# Patient Record
Sex: Female | Born: 1950 | Race: Black or African American | Hispanic: No | Marital: Married | Smoking: Former smoker
Health system: Southern US, Community
[De-identification: ages and names within clinical notes are randomized; demographics above are authoritative.]

## PROBLEM LIST (undated history)

## (undated) DIAGNOSIS — T7840XA Allergy, unspecified, initial encounter: Secondary | ICD-10-CM

## (undated) DIAGNOSIS — E785 Hyperlipidemia, unspecified: Secondary | ICD-10-CM

## (undated) DIAGNOSIS — I10 Essential (primary) hypertension: Secondary | ICD-10-CM

## (undated) HISTORY — PX: ABDOMINAL HYSTERECTOMY: SHX81

## (undated) HISTORY — DX: Essential (primary) hypertension: I10

## (undated) HISTORY — DX: Hyperlipidemia, unspecified: E78.5

## (undated) HISTORY — DX: Allergy, unspecified, initial encounter: T78.40XA

## (undated) HISTORY — PX: BREAST LUMPECTOMY: SHX2

## (undated) HISTORY — PX: APPENDECTOMY: SHX54

## (undated) HISTORY — PX: CHOLECYSTECTOMY: SHX55

---

## 2010-11-23 DIAGNOSIS — F430 Acute stress reaction: Secondary | ICD-10-CM | POA: Insufficient documentation

## 2010-11-23 DIAGNOSIS — N951 Menopausal and female climacteric states: Secondary | ICD-10-CM | POA: Insufficient documentation

## 2011-12-27 DIAGNOSIS — K5790 Diverticulosis of intestine, part unspecified, without perforation or abscess without bleeding: Secondary | ICD-10-CM | POA: Insufficient documentation

## 2012-12-31 LAB — HM MAMMOGRAPHY

## 2013-01-05 ENCOUNTER — Ambulatory Visit: Payer: BC Managed Care – PPO | Admitting: Emergency Medicine

## 2013-01-05 ENCOUNTER — Ambulatory Visit: Payer: BC Managed Care – PPO

## 2013-01-05 VITALS — BP 138/88 | HR 74 | Temp 97.8°F | Resp 18 | Ht 64.0 in | Wt 179.0 lb

## 2013-01-05 DIAGNOSIS — M25562 Pain in left knee: Secondary | ICD-10-CM

## 2013-01-05 DIAGNOSIS — M25569 Pain in unspecified knee: Secondary | ICD-10-CM

## 2013-01-05 DIAGNOSIS — M2390 Unspecified internal derangement of unspecified knee: Secondary | ICD-10-CM

## 2013-01-05 LAB — POCT CBC
Granulocyte percent: 38.6 %G (ref 37–80)
HCT, POC: 42.4 % (ref 37.7–47.9)
Hemoglobin: 13.8 g/dL (ref 12.2–16.2)
Lymph, poc: 3.8 — AB (ref 0.6–3.4)
MPV: 11.3 fL (ref 0–99.8)
POC Granulocyte: 2.7 (ref 2–6.9)
POC MID %: 7 %M (ref 0–12)

## 2013-01-05 LAB — POCT SEDIMENTATION RATE: POCT SED RATE: 55 mm/hr — AB (ref 0–22)

## 2013-01-05 MED ORDER — NAPROXEN SODIUM 550 MG PO TABS: 550.0000 mg | ORAL_TABLET | Freq: Two times a day (BID) | ORAL | Status: AC

## 2013-01-05 NOTE — Progress Notes (Signed)
[facility] [address], [place] [address] [phone]  Date:  01/05/2013   Name:  Cynthia Stewart   DOB:  1950/06/17   MRN:  [id number]  PCP:  Primus Bravo, NP    Chief Complaint: Knee Pain   History of Present Illness:  Cynthia Stewart is a 62 y.o. very pleasant female patient who presents with the following:  No history of injury.  Awoke Thursday morning with pain in her left knee.  No antecedent infection, fever or chills.  History of prior left knee pain seen by ortho and no diagnosis made per patient.  Says pain is worse with ambulation and the swelling interferes with flexion and extension.  No improvement with over the counter medications or other home remedies. Denies other complaint or health concern today.   There are no active problems to display for this patient.   Past Medical History  Diagnosis Date  . Allergy   . Hypertension     Past Surgical History  Procedure Laterality Date  . Abdominal hysterectomy    . Appendectomy    . Cholecystectomy      History  Substance Use Topics  . Smoking status: Never Smoker   . Smokeless tobacco: Not on file  . Alcohol Use: Not on file    Family History  Problem Relation Age of Onset  . Hypertension Mother   . Diabetes Mother     Allergies  Allergen Reactions  . Codeine   . Percocet [Oxycodone-Acetaminophen]     Medication list has been reviewed and updated.  No current outpatient prescriptions on file prior to visit.   No current facility-administered medications on file prior to visit.    Review of Systems:  As per HPI, otherwise negative.    Physical Examination: Filed Vitals:   01/05/13 1534  BP: 138/88  Pulse: 74  Temp: 97.8 F (36.6 C)  Resp: 18   Filed Vitals:   01/05/13 1534  Height: 5\' 4"  (1.626 m)  Weight: 179 lb (81.194 kg)   Body mass index is 30.71 kg/(m^2). Ideal Body Weight: Weight in (lb) to have BMI = 25: 145.3   GEN: WDWN, NAD, Non-toxic,  Alert & Oriented x 3 HEENT: Atraumatic, Normocephalic.  Ears and Nose: No external deformity. EXTR: No clubbing/cyanosis/edema NEURO: Normal gait.  PSYCH: Normally interactive. Conversant. Not depressed or anxious appearing.  Calm demeanor.  LEFT knee:  Moderate effusion and tenderness. Warm.  Unable to extend past 160 degrees or flex past 90 degrees.  Joint stable.    Assessment and Plan: Internal derangement knee Anaprox Crutches  Signed,  Phillips Odor, MD  [facility] reading (PRIMARY) by  Dr. Dareen Piano.  Negative knee.    Results for orders placed in visit on 01/05/13  POCT CBC      Result Value Range   WBC 7.0  4.6 - 10.2 K/uL   Lymph, poc 3.8 (*) 0.6 - 3.4   POC LYMPH PERCENT 54.4 (*) 10 - 50 %L   MID (cbc) 0.5  0 - 0.9   POC MID % 7.0  0 - 12 %M   POC Granulocyte 2.7  2 - 6.9   Granulocyte percent 38.6  37 - 80 %G   RBC 4.67  4.04 - 5.48 M/uL   Hemoglobin 13.8  12.2 - 16.2 g/dL   HCT, POC 08.6  57.8 - 47.9 %   MCV 90.8  80 - 97 fL   MCH, POC 29.6  27 - 31.2 pg  MCHC 32.5  31.8 - 35.4 g/dL   RDW, POC 16.1     Platelet Count, POC 200  142 - 424 K/uL   MPV 11.3  0 - 99.8 fL

## 2013-01-05 NOTE — Patient Instructions (Signed)
Wear and Tear Disorders of the Knee (Arthritis, Osteoarthritis)  Everyone will experience wear and tear injuries (arthritis, osteoarthritis) of the knee. These are the changes we all get as we age. They come from the joint stress of daily living. The amount of cartilage damage in your knee and your symptoms determine if you need surgery. Mild problems require approximately two months recovery time. More severe problems take several months to recover. With mild problems, your surgeon may find worn and rough cartilage surfaces. With severe changes, your surgeon may find cartilage that has completely worn away and exposed the bone. Loose bodies of bone and cartilage, bone spurs (excess bone growth), and injuries to the menisci (cushions between the large bones of your leg) are also common. All of these problems can cause pain.  For a mild wear and tear problem, rough cartilage may simply need to be shaved and smoothed. For more severe problems with areas of exposed bone, your surgeon may use an instrument for roughing up the bone surfaces to stimulate new cartilage growth. Loose bodies are usually removed. Torn menisci may be trimmed or repaired.  ABOUT THE ARTHROSCOPIC PROCEDURE  Arthroscopy is a surgical technique. It allows your orthopedic surgeon to diagnose and treat your knee injury with accuracy. The surgeon looks into your knee through a small scope. The scope is like a small (pencil-sized) telescope. Arthroscopy is less invasive than open knee surgery. You can expect a more rapid recovery. After the procedure, you will be moved to a recovery area until most of the effects of the medication have worn off. Your caregiver will discuss the test results with you.  RECOVERY  The severity of the arthritis and the type of procedure performed will determine recovery time. Other important factors include age, physical condition, medical conditions, and the type of rehabilitation program. Strengthening your muscles after  arthroscopy helps guarantee a better recovery. Follow your caregiver's instructions. Use crutches, rest, elevate, ice, and do knee exercises as instructed. Your caregivers will help you and instruct you with exercises and other physical therapy required to regain your mobility, muscle strength, and functioning following surgery. Only take over-the-counter or prescription medicines for pain, discomfort, or fever as directed by your caregiver.   SEEK MEDICAL CARE IF:   · There is increased bleeding (more than a small spot) from the wound.  · You notice redness, swelling, or increasing pain in the wound.  · Pus is coming from wound.  · You develop an unexplained oral temperature above 102° F (38.9° C) , or as your caregiver suggests.  · You notice a foul smell coming from the wound or dressing.  · You have severe pain with motion of the knee.  SEEK IMMEDIATE MEDICAL CARE IF:   · You develop a rash.  · You have difficulty breathing.  · You have any allergic problems.  MAKE SURE YOU:   · Understand these instructions.  · Will watch your condition.  · Will get help right away if you are not doing well or get worse.  Document Released: 03/04/2000 Document Revised: 05/30/2011 Document Reviewed: 08/01/2007  Exit[employer]® Patient Information ©2014 [employer].

## 2013-01-07 ENCOUNTER — Telehealth: Payer: Self-pay

## 2013-01-07 NOTE — Telephone Encounter (Signed)
Pt request a copy of xrays of left knee for an ortho eval

## 2013-09-03 ENCOUNTER — Ambulatory Visit (INDEPENDENT_AMBULATORY_CARE_PROVIDER_SITE_OTHER): Payer: BC Managed Care – PPO | Admitting: Physician Assistant

## 2013-09-03 VITALS — BP 126/72 | HR 59 | Temp 97.8°F | Ht 65.5 in | Wt 179.4 lb

## 2013-09-03 DIAGNOSIS — H9209 Otalgia, unspecified ear: Secondary | ICD-10-CM

## 2013-09-03 DIAGNOSIS — J329 Chronic sinusitis, unspecified: Secondary | ICD-10-CM

## 2013-09-03 DIAGNOSIS — R42 Dizziness and giddiness: Secondary | ICD-10-CM

## 2013-09-03 MED ORDER — FLUCONAZOLE 150 MG PO TABS: 150.0000 mg | ORAL_TABLET | Freq: Once | ORAL | Status: DC

## 2013-09-03 MED ORDER — AMOXICILLIN-POT CLAVULANATE 875-125 MG PO TABS: 1.0000 | ORAL_TABLET | Freq: Two times a day (BID) | ORAL | Status: DC

## 2013-09-03 NOTE — Progress Notes (Signed)
   Subjective:    Patient ID: Cynthia Stewart, female    DOB: [birth year], 63 y.o.   MRN: [id number]  HPI 63 year old female presents for evaluation of possible sinus infection.  States she has had a total of 3 weeks of right sided facial pain, nasal congestion, and rhinorrhea.  She has had dizziness and slight nausea but no vomiting.  Denies cough, tinnitus, fever, chills, SOB, wheezing, sore throat, or otalgia.  Hx of sinus infections that present similarly to this.   Has been taken Allegra OTC and tylenol which does "take the edge off" of her headache.      Review of Systems  Constitutional: Negative for fever and chills.  HENT: Positive for congestion, rhinorrhea and sinus pressure. Negative for ear pain and sore throat.   Respiratory: Negative for cough, shortness of breath and wheezing.   Gastrointestinal: Positive for nausea. Negative for vomiting and abdominal pain.  Neurological: Positive for dizziness. Negative for headaches.       Objective:   Physical Exam  Constitutional: She is oriented to person, place, and time. She appears well-developed and well-nourished.  HENT:  Head: Normocephalic and atraumatic.  Right Ear: Hearing, tympanic membrane, external ear and ear canal normal.  Left Ear: Hearing, tympanic membrane, external ear and ear canal normal.  Nose: Right sinus exhibits maxillary sinus tenderness. Right sinus exhibits no frontal sinus tenderness. Left sinus exhibits no maxillary sinus tenderness and no frontal sinus tenderness.  Mouth/Throat: Uvula is midline, oropharynx is clear and moist and mucous membranes are normal.  Eyes: Conjunctivae and EOM are normal. Pupils are equal, round, and reactive to light.  Cardiovascular: Normal rate, regular rhythm and normal heart sounds.   Pulmonary/Chest: Effort normal and breath sounds normal.  Neurological: She is alert and oriented to person, place, and time.  Psychiatric: She has a normal mood and affect. Her behavior is  normal. Judgment and thought content normal.          Assessment & Plan:  Sinus infection - Plan: amoxicillin-clavulanate (AUGMENTIN) 875-125 MG per tablet  Dizziness and giddiness  Otalgia  Will treat for sinus infection with Augmentin 875 mg bid x 10 days Recommend Mucinex twice daily as directed May continue tylenol prn pain RTC precautions discussed. Follow up if symptoms worsen or fail to improve.

## 2013-09-11 NOTE — Progress Notes (Signed)
Appointment scheduled on 10/11/2013  @ 830. With Dr. Conley RollsLe to establish care.

## 2013-10-11 ENCOUNTER — Encounter: Payer: Self-pay | Admitting: Family Medicine

## 2013-10-11 ENCOUNTER — Ambulatory Visit (INDEPENDENT_AMBULATORY_CARE_PROVIDER_SITE_OTHER): Payer: BC Managed Care – PPO | Admitting: Family Medicine

## 2013-10-11 VITALS — BP 118/70 | HR 63 | Temp 97.7°F | Resp 16 | Ht 63.75 in | Wt 177.0 lb

## 2013-10-11 DIAGNOSIS — R7309 Other abnormal glucose: Secondary | ICD-10-CM

## 2013-10-11 DIAGNOSIS — R232 Flushing: Secondary | ICD-10-CM

## 2013-10-11 DIAGNOSIS — E785 Hyperlipidemia, unspecified: Secondary | ICD-10-CM

## 2013-10-11 DIAGNOSIS — I10 Essential (primary) hypertension: Secondary | ICD-10-CM

## 2013-10-11 DIAGNOSIS — N951 Menopausal and female climacteric states: Secondary | ICD-10-CM

## 2013-10-11 DIAGNOSIS — G4700 Insomnia, unspecified: Secondary | ICD-10-CM

## 2013-10-11 DIAGNOSIS — R739 Hyperglycemia, unspecified: Secondary | ICD-10-CM

## 2013-10-11 LAB — LIPID PANEL
Cholesterol: 253 mg/dL — ABNORMAL HIGH (ref 0–200)
HDL: 72 mg/dL (ref >39)
LDL Cholesterol: 167 mg/dL — ABNORMAL HIGH (ref 0–99)
Total CHOL/HDL Ratio: 3.5 ratio
Triglycerides: 71 mg/dL (ref <150)
VLDL: 14 mg/dL (ref 0–40)

## 2013-10-11 LAB — COMPLETE METABOLIC PANEL WITHOUT GFR
AST: 18 U/L (ref 0–37)
BUN: 20 mg/dL (ref 6–23)
GFR, Est Non African American: 59 mL/min — ABNORMAL LOW
Glucose, Bld: 105 mg/dL — ABNORMAL HIGH (ref 70–99)

## 2013-10-11 LAB — COMPLETE METABOLIC PANEL WITH GFR
ALT: 15 U/L (ref 0–35)
Albumin: 4.4 g/dL (ref 3.5–5.2)
Alkaline Phosphatase: 54 U/L (ref 39–117)
CO2: 28 mEq/L (ref 19–32)
Calcium: 10.2 mg/dL (ref 8.4–10.5)
Chloride: 100 mEq/L (ref 96–112)
Creat: 1.03 mg/dL (ref 0.50–1.10)
GFR, Est African American: 68 mL/min
Potassium: 4.5 mEq/L (ref 3.5–5.3)
Sodium: 137 mEq/L (ref 135–145)
Total Bilirubin: 0.7 mg/dL (ref 0.2–1.2)
Total Protein: 7.8 g/dL (ref 6.0–8.3)

## 2013-10-11 LAB — TSH: TSH: 0.994 u[IU]/mL (ref 0.350–4.500)

## 2013-10-11 LAB — POCT GLYCOSYLATED HEMOGLOBIN (HGB A1C): Hemoglobin A1C: 6.4

## 2013-10-11 MED ORDER — ROSUVASTATIN CALCIUM 5 MG PO TABS: 5.0000 mg | ORAL_TABLET | Freq: Every day | ORAL | Status: DC

## 2013-10-11 MED ORDER — ESTRADIOL 0.05 MG/24HR TD PTTW: 1.0000 | MEDICATED_PATCH | TRANSDERMAL | Status: DC

## 2013-10-11 MED ORDER — VALSARTAN-HYDROCHLOROTHIAZIDE 320-12.5 MG PO TABS: 1.0000 | ORAL_TABLET | Freq: Every day | ORAL | Status: DC

## 2013-10-11 MED ORDER — TRAZODONE HCL 50 MG PO TABS: 25.0000 mg | ORAL_TABLET | Freq: Every evening | ORAL | Status: DC | PRN

## 2013-10-11 NOTE — Progress Notes (Signed)
Chief Complaint:  Chief Complaint  Patient presents with  . Establish Care    and medication refill - CRESTOR AND DIOVAN-HCT    HPI: Cynthia Stewart is a 63 y.o. female who is here for refills on her cholesterol medicine and also her blood pressure medicine She has been taking her meds without SEs SHe was seeing Dr Hollice [place]Gibson in [place] but would like to have someone closer She was putl on generic vivelle dot and that was not working for her, the [place].  She states that the brand vivelle dot actually improed her sxs, she would liek to be on it again if her nsurance approves, it was changed because of the insurance formulary She was previously borderline DM and would like to get her A1c checked   Past Medical History  Diagnosis Date  . Allergy   . Hypertension   . Hyperlipidemia    Past Surgical History  Procedure Laterality Date  . Abdominal hysterectomy    . Appendectomy    . Cholecystectomy     History   Social History  . Marital Status: Married    Spouse Name: N/A    Number of Children: N/A  . Years of Education: N/A   Social History Main Topics  . Smoking status: Never Smoker   . Smokeless tobacco: None  . Alcohol Use: None  . Drug Use: None  . Sexual Activity: None   Other Topics Concern  . None   Social History Narrative  . None   Family History  Problem Relation Age of Onset  . Hypertension Mother   . Diabetes Mother    Allergies  Allergen Reactions  . Codeine Other (See Comments)    HALLUCINATION  . Percocet [Oxycodone-Acetaminophen] Itching and Other (See Comments)    HALLUCINATION   Prior to Admission medications   Medication Sig Start Date End Date Taking? Authorizing Provider  Ergocalciferol (VITAMIN D2 PO) Take by mouth 2 (two) times a week.   Yes Historical Provider, MD  estradiol (CLIMARA - DOSED IN MG/24 HR) 0.05 mg/24hr patch Place 0.05 mg onto the skin once a week.   Yes Historical Provider, MD  rosuvastatin (CRESTOR) 5 MG  tablet Take 5 mg by mouth at bedtime.   Yes Historical Provider, MD  valsartan-hydrochlorothiazide (DIOVAN-HCT) 320-12.5 MG per tablet Take 1 tablet by mouth daily.   Yes Historical Provider, MD  amoxicillin-clavulanate (AUGMENTIN) 875-125 MG per tablet Take 1 tablet by mouth 2 (two) times daily. 09/03/13   Heather Jaquita RectorM Marte, PA-C  fluconazole (DIFLUCAN) 150 MG tablet Take 1 tablet (150 mg total) by mouth once. Repeat if needed 09/03/13   Nelva NayHeather M Marte, PA-C  naproxen sodium (ANAPROX DS) 550 MG tablet Take 1 tablet (550 mg total) by mouth 2 (two) times daily with a meal. 01/05/13 01/05/14  Phillips OdorJeffery Anderson, MD     ROS: The patient denies fevers, chills, night sweats, unintentional weight loss, chest pain, palpitations, wheezing, dyspnea on exertion, nausea, vomiting, abdominal pain, dysuria, hematuria, melena, numbness, weakness, or tingling.   All other systems have been reviewed and were otherwise negative with the exception of those mentioned in the HPI and as above.    PHYSICAL EXAM: Filed Vitals:   10/11/13 0829  BP: 118/70  Pulse: 63  Temp: 97.7 F (36.5 C)  Resp: 16   Filed Vitals:   10/11/13 0829  Height: 5' 3.75" (1.619 m)  Weight: 177 lb (80.287 kg)   Body mass index is 30.63 kg/(m^2).  General: Alert, no acute distress HEENT:  Normocephalic, atraumatic, oropharynx patent. EOMI, PERRLA Cardiovascular:  Regular rate and rhythm, no rubs murmurs or gallops.  No Carotid bruits, radial pulse intact. No pedal edema.  Respiratory: Clear to auscultation bilaterally.  No wheezes, rales, or rhonchi.  No cyanosis, no use of accessory musculature GI: No organomegaly, abdomen is soft and non-tender, positive bowel sounds.  No masses. Skin: No rashes. Neurologic: Facial musculature symmetric. Psychiatric: Patient is appropriate throughout our interaction. Lymphatic: No cervical lymphadenopathy Musculoskeletal: Gait intact.   LABS: Results for orders placed in visit on 10/11/13    COMPLETE METABOLIC PANEL WITH GFR      Result Value Ref Range   Sodium 137  135 - 145 mEq/L   Potassium 4.5  3.5 - 5.3 mEq/L   Chloride 100  96 - 112 mEq/L   CO2 28  19 - 32 mEq/L   Glucose, Bld 105 (*) 70 - 99 mg/dL   BUN 20  6 - 23 mg/dL   Creat 1.61  0.96 - 0.45 mg/dL   Total Bilirubin 0.7  0.2 - 1.2 mg/dL   Alkaline Phosphatase 54  39 - 117 U/L   AST 18  0 - 37 U/L   ALT 15  0 - 35 U/L   Total Protein 7.8  6.0 - 8.3 g/dL   Albumin 4.4  3.5 - 5.2 g/dL   Calcium 40.9  8.4 - 81.1 mg/dL   GFR, Est African American 68     GFR, Est Non African American 59 (*)   LIPID PANEL      Result Value Ref Range   Cholesterol 253 (*) 0 - 200 mg/dL   Triglycerides 71  <914 mg/dL   HDL 72  >78 mg/dL   Total CHOL/HDL Ratio 3.5     VLDL 14  0 - 40 mg/dL   LDL Cholesterol 295 (*) 0 - 99 mg/dL  TSH      Result Value Ref Range   TSH 0.994  0.350 - 4.500 uIU/mL  POCT GLYCOSYLATED HEMOGLOBIN (HGB A1C)      Result Value Ref Range   Hemoglobin A1C 6.4       EKG/XRAY:   Primary read interpreted by Dr. Conley Rolls at [facility].   ASSESSMENT/PLAN: Encounter Diagnoses  Name Primary?  . Essential hypertension Yes  . Other and unspecified hyperlipidemia   . Hyperglycemia   . Hot flashes   . Insomnia    Refilled meds Labs Vivelle generic does not work for her. Brand name works for her .  She was on Vivelle DOT brand name prior. WOuld liek that again F/u in 6 months   Gross sideeffects, risk and benefits, and alternatives of medications d/w patient. Patient is aware that all medications have potential sideeffects and we are unable to predict every sideeffect or drug-drug interaction that may occur.  Hamilton Capri PHUONG, DO 10/13/2013 3:53 PM

## 2013-10-13 ENCOUNTER — Encounter: Payer: Self-pay | Admitting: Family Medicine

## 2013-10-15 ENCOUNTER — Encounter: Payer: Self-pay | Admitting: Family Medicine

## 2013-10-15 ENCOUNTER — Telehealth: Payer: Self-pay | Admitting: Family Medicine

## 2013-10-15 NOTE — Telephone Encounter (Signed)
Unable to leave a message, VM full, , will send a letter

## 2013-10-22 ENCOUNTER — Telehealth: Payer: Self-pay

## 2013-10-22 NOTE — Telephone Encounter (Signed)
Pt of Dr. Conley RollsLe says her heat was out of rhythm for about 10 minutes, but now it is back to normal, and she feels ok. She would like to know if Dr.Le would like her to have an EKG done. Please advise pt

## 2013-10-22 NOTE — Telephone Encounter (Signed)
Spoke to pt, she stated she wasn't doing any type of activity Lasted 10 min, breathing was a bit faster.  Denies headache, nausea, dizziness, not light headed, no blurry vision,  No SOB, No chest pain, no arm pain no shoulder pain, denies muscle weakness, no tingling, denies jaw pain, no speech changes.  i advised her this can be normal, but just be aware if any of these symptoms occur please call back, come in or go to the ED.  Patient expressed an understanding and felt much better after our conversation.

## 2014-01-15 ENCOUNTER — Ambulatory Visit (INDEPENDENT_AMBULATORY_CARE_PROVIDER_SITE_OTHER): Payer: BC Managed Care – PPO | Admitting: Physician Assistant

## 2014-01-15 VITALS — BP 134/82 | HR 78 | Temp 98.0°F | Resp 20 | Ht 63.5 in | Wt 179.4 lb

## 2014-01-15 DIAGNOSIS — I10 Essential (primary) hypertension: Secondary | ICD-10-CM | POA: Insufficient documentation

## 2014-01-15 DIAGNOSIS — J018 Other acute sinusitis: Secondary | ICD-10-CM

## 2014-01-15 MED ORDER — AMOXICILLIN 875 MG PO TABS: 1750.0000 mg | ORAL_TABLET | Freq: Two times a day (BID) | ORAL | Status: DC

## 2014-01-15 NOTE — Patient Instructions (Signed)

## 2014-01-15 NOTE — Progress Notes (Signed)
   Subjective:    Patient ID: Cynthia Stewart, female    DOB: 03-18-1951, 63 y.o.   MRN: [id number]  HPI Patient presents with 4 weeks of sinus pressure/pain, ear pressure, rhinorrhea, and sneezing. Sx had been the same and maintained with tylenol, allegra and nettie pot up until Thursday. Starting Thursday began to also feel dizzy and off balance, have intermittent chills, nausea, and maxillary pain. Denies cough or fever. Has had sinus infections in the past and has h/o seasonal allergies. Denies sick contacts.  Review of Systems  Constitutional: Positive for chills, diaphoresis and fatigue. Negative for fever.  HENT: Positive for congestion, dental problem (upper), ear pain (pressure), postnasal drip, rhinorrhea, sinus pressure, sneezing and sore throat (in am). Negative for ear discharge, facial swelling, hearing loss and tinnitus.   Eyes: Positive for pain, discharge (clear) and itching. Negative for visual disturbance.  Respiratory: Negative for cough, chest tightness, shortness of breath and wheezing.   Cardiovascular: Negative for chest pain, palpitations and leg swelling.  Gastrointestinal: Positive for nausea. Negative for vomiting, abdominal pain, diarrhea and constipation.  Musculoskeletal: Negative for neck pain and neck stiffness.  Skin: Negative for rash.  Allergic/Immunologic: Positive for environmental allergies (seasonal). Negative for food allergies.  Neurological: Positive for dizziness (off balance). Negative for syncope, light-headedness and headaches.  Hematological: Negative for adenopathy.       Objective:   Physical Exam  Constitutional: She is oriented to person, place, and time. She appears well-developed and well-nourished. No distress.  Blood pressure 134/82, pulse 78, temperature 98 F (36.7 C), temperature source Oral, resp. rate 20, height 5' 3.5" (1.613 m), weight 179 lb 6.4 oz (81.375 kg), SpO2 96.00%.'  HENT:  Head: Normocephalic and atraumatic.  Right  Ear: Tympanic membrane, external ear and ear canal normal. No drainage, swelling or tenderness. No middle ear effusion.  Left Ear: Tympanic membrane, external ear and ear canal normal. No drainage, swelling or tenderness. Left ear middle ear effusion: serous.  Nose: Mucosal edema, rhinorrhea and sinus tenderness present. Right sinus exhibits maxillary sinus tenderness and frontal sinus tenderness. Left sinus exhibits maxillary sinus tenderness and frontal sinus tenderness.  Mouth/Throat: Uvula is midline. Posterior oropharyngeal erythema (mild) present. No oropharyngeal exudate or posterior oropharyngeal edema.  Eyes: Pupils are equal, round, and reactive to light. Right eye exhibits no discharge. Left eye exhibits no discharge. No scleral icterus.  Neck: Normal range of motion. Neck supple.  Cardiovascular: Regular rhythm and normal heart sounds.  Exam reveals no gallop and no friction rub.   No murmur heard. Pulmonary/Chest: Effort normal and breath sounds normal. No respiratory distress. She has no wheezes. She has no rales.  Abdominal: Soft. Bowel sounds are normal. She exhibits no mass. There is no tenderness.  Lymphadenopathy:    She has cervical adenopathy.  Neurological: She is alert and oriented to person, place, and time.  Skin: Skin is warm and dry. She is not diaphoretic. No erythema. No pallor.        Assessment & Plan:  1. Acute sinusitis - amoxicillin (AMOXIL) 875 MG tablet; Take 2 tablets (1,750 mg total) by mouth 2 (two) times daily.  Dispense: 20 tablet; Refill: 0 - Encouraged to get plenty of fluid and water.  - Can continue tylenol for pain and start mucinex.  Janan Ridgeishira Veneda Kirksey PA-C  [facility] 01/15/2014 7:41 PM

## 2014-01-16 NOTE — Progress Notes (Signed)
I have discussed this case with Ms. Brewington, PA-C and agree.  

## 2014-02-07 ENCOUNTER — Encounter: Payer: Self-pay | Admitting: Family Medicine

## 2014-02-07 ENCOUNTER — Ambulatory Visit (INDEPENDENT_AMBULATORY_CARE_PROVIDER_SITE_OTHER): Payer: BC Managed Care – PPO | Admitting: Family Medicine

## 2014-02-07 VITALS — BP 128/76 | HR 72 | Temp 97.7°F | Resp 16 | Ht 64.0 in | Wt 178.6 lb

## 2014-02-07 DIAGNOSIS — I10 Essential (primary) hypertension: Secondary | ICD-10-CM

## 2014-02-07 DIAGNOSIS — E7800 Pure hypercholesterolemia, unspecified: Secondary | ICD-10-CM | POA: Insufficient documentation

## 2014-02-07 DIAGNOSIS — Z1159 Encounter for screening for other viral diseases: Secondary | ICD-10-CM

## 2014-02-07 DIAGNOSIS — J3089 Other allergic rhinitis: Secondary | ICD-10-CM

## 2014-02-07 DIAGNOSIS — Z1239 Encounter for other screening for malignant neoplasm of breast: Secondary | ICD-10-CM

## 2014-02-07 DIAGNOSIS — E119 Type 2 diabetes mellitus without complications: Secondary | ICD-10-CM | POA: Insufficient documentation

## 2014-02-07 DIAGNOSIS — Z23 Encounter for immunization: Secondary | ICD-10-CM

## 2014-02-07 DIAGNOSIS — Z0000 Encounter for general adult medical examination without abnormal findings: Secondary | ICD-10-CM

## 2014-02-07 DIAGNOSIS — R7309 Other abnormal glucose: Secondary | ICD-10-CM

## 2014-02-07 LAB — COMPREHENSIVE METABOLIC PANEL
ALBUMIN: 3.9 g/dL (ref 3.5–5.2)
ALT: 12 U/L (ref 0–35)
AST: 18 U/L (ref 0–37)
Alkaline Phosphatase: 47 U/L (ref 39–117)
BUN: 14 mg/dL (ref 6–23)
CALCIUM: 9.5 mg/dL (ref 8.4–10.5)
CHLORIDE: 101 meq/L (ref 96–112)
CO2: 25 meq/L (ref 19–32)
Creat: 0.96 mg/dL (ref 0.50–1.10)
Glucose, Bld: 113 mg/dL — ABNORMAL HIGH (ref 70–99)
Potassium: 4.3 mEq/L (ref 3.5–5.3)
SODIUM: 138 meq/L (ref 135–145)
TOTAL PROTEIN: 6.9 g/dL (ref 6.0–8.3)
Total Bilirubin: 0.6 mg/dL (ref 0.2–1.2)

## 2014-02-07 LAB — CBC WITH DIFFERENTIAL/PLATELET
Basophils Absolute: 0 10*3/uL (ref 0.0–0.1)
Basophils Relative: 1 % (ref 0–1)
EOS ABS: 0.1 10*3/uL (ref 0.0–0.7)
EOS PCT: 3 % (ref 0–5)
HEMATOCRIT: 41.3 % (ref 36.0–46.0)
HEMOGLOBIN: 14.5 g/dL (ref 12.0–15.0)
Lymphocytes Relative: 54 % — ABNORMAL HIGH (ref 12–46)
Lymphs Abs: 2.3 10*3/uL (ref 0.7–4.0)
MCH: 29.4 pg (ref 26.0–34.0)
MCHC: 35.1 g/dL (ref 30.0–36.0)
MCV: 83.8 fL (ref 78.0–100.0)
MONOS PCT: 7 % (ref 3–12)
MPV: 12.7 fL — ABNORMAL HIGH (ref 9.4–12.4)
Monocytes Absolute: 0.3 10*3/uL (ref 0.1–1.0)
Neutro Abs: 1.5 10*3/uL — ABNORMAL LOW (ref 1.7–7.7)
Neutrophils Relative %: 35 % — ABNORMAL LOW (ref 43–77)
Platelets: 209 10*3/uL (ref 150–400)
RBC: 4.93 MIL/uL (ref 3.87–5.11)
RDW: 14.2 % (ref 11.5–15.5)
WBC: 4.3 10*3/uL (ref 4.0–10.5)

## 2014-02-07 LAB — POCT UA - MICROSCOPIC ONLY
CASTS, UR, LPF, POC: NEGATIVE
CRYSTALS, UR, HPF, POC: NEGATIVE
Mucus, UA: POSITIVE
Yeast, UA: NEGATIVE

## 2014-02-07 LAB — POCT URINALYSIS DIPSTICK
Bilirubin, UA: NEGATIVE
Glucose, UA: NEGATIVE
Ketones, UA: NEGATIVE
LEUKOCYTES UA: NEGATIVE
Nitrite, UA: NEGATIVE
PROTEIN UA: NEGATIVE
SPEC GRAV UA: 1.015
UROBILINOGEN UA: 0.2
pH, UA: 5

## 2014-02-07 LAB — HEPATITIS C ANTIBODY: HCV Ab: NEGATIVE

## 2014-02-07 LAB — HEMOGLOBIN A1C
Hgb A1c MFr Bld: 7 % — ABNORMAL HIGH (ref <5.7)
Mean Plasma Glucose: 154 mg/dL — ABNORMAL HIGH (ref <117)

## 2014-02-07 MED ORDER — AZELASTINE HCL 0.1 % NA SOLN: 2.0000 | Freq: Two times a day (BID) | NASAL | Status: DC

## 2014-02-07 MED ORDER — ZOSTER VACCINE LIVE 19400 UNT/0.65ML ~~LOC~~ SOLR: 0.6500 mL | Freq: Once | SUBCUTANEOUS | Status: DC

## 2014-02-07 NOTE — Progress Notes (Signed)
IDENTIFYING INFORMATION  Cynthia Stewart / DOB: 1950-04-05 / MRN: [id number]  The patient has Essential hypertension; Elevated hemoglobin A1c measurement; Elevated LDL cholesterol level; and Environmental and seasonal allergies on her problem list.  SUBJECTIVE  Chief Complaint: Annual Exam   History of present illness: Cynthia Stewart is a 63 y.o. year old female who presents for an annual screening.  She reports that she is trying to start a diet and exercise program at this time, and thinks that the lack of these things is causing her blood sugar to be abnormally high.  She denies a family history of heart disease, and reports that her mother had a history of diabetes controlled by diet.    She reports that she does not use her trazodone for sleep, and uses melatonin 10 mg po qhs, as this works better for her.    She would like an annual mammography screening ordered today.  She has had benign bilateral breast masses removed in the past.    She denies dysthymic mood and anhedonia.  She denies a history of depression. She reports having work stress and financial stress.     She had a benign subcutaneous sternal mass that was removed in 2011. She reports having shingles in 2014 around the left waist.  She received treatment for this and has no difficulty with this now.    Last physical: About this time one year ago (2014) Pap smear: 2013, negative for HPV and cervical changes Mammogram: October 2014 Colonoscopy: 2013, negative for polyps, not documented in [facility]: Unknown, will receive this today.  Pneumovax: N/A  Zostavax: Never received  Influenza: Negative Eye exam: June of 15 Dental exam: June of 15   She  has a past medical history of Allergy; Hypertension; and Hyperlipidemia.    She has a current medication list which includes the following prescription(s): ergocalciferol, estradiol, rosuvastatin, trazodone, and  valsartan-hydrochlorothiazide.  Cynthia Stewart is allergic to codeine and percocet. She  reports that she has never smoked. She has never used smokeless tobacco. She reports that she does not drink alcohol. She  reports that she currently engages in sexual activity.  The patient  has past surgical history that includes Abdominal hysterectomy; Appendectomy; and Cholecystectomy.  Her family history includes Diabetes in her mother; Hyperlipidemia in her mother and sister; Hypertension in her brother, mother, and sister.  Review of Systems  Constitutional: Negative for fever, chills, weight loss, malaise/fatigue and diaphoresis.  HENT: Positive for congestion.   Eyes: Negative.   Respiratory: Negative.   Cardiovascular: Negative.   Gastrointestinal: Negative.   Genitourinary: Negative.   Musculoskeletal: Negative.   Skin: Negative.   Neurological: Positive for headaches. Negative for weakness.  Endo/Heme/Allergies: Negative.   Psychiatric/Behavioral: Negative.     OBJECTIVE  There were no vitals taken for this visit. The patient's body mass index is 30.64 kg/(m^2).  Physical Exam  Constitutional: She is oriented to person, place, and time. She appears well-developed and well-nourished. No distress.  HENT:  Head: Normocephalic.  Right Ear: External ear normal.  Left Ear: External ear normal.  Nose: Nose normal.  Mouth/Throat: No oropharyngeal exudate.  Eyes: Conjunctivae and EOM are normal. Pupils are equal, round, and reactive to light.  Neck: Normal range of motion. No thyromegaly present.  Cardiovascular: Normal rate, regular rhythm, normal heart sounds and intact distal pulses.  Exam reveals no gallop and no friction rub.   No murmur heard. Respiratory: Effort normal and  breath sounds normal. She has no wheezes.  GI: Soft. Bowel sounds are normal. She exhibits no distension.  Musculoskeletal: Normal range of motion. She exhibits no edema or tenderness.  Lymphadenopathy:    She has  no cervical adenopathy.  Neurological: She is alert and oriented to person, place, and time. She has normal reflexes. No cranial nerve deficit. She exhibits normal muscle tone. Coordination normal.  Skin: Skin is warm and dry. She is not diaphoretic.  Psychiatric: She has a normal mood and affect. Her behavior is normal. Judgment and thought content normal.    No results found for this or any previous visit (from the past 24 hour(s)).  ASSESSMENT & PLAN  Cynthia Stewart was seen today for annual exam.  Diagnoses and associated orders for this visit:  Encounter for annual physical exam - POCT UA - Microscopic Only - POCT urinalysis dipstic  Elevated hemoglobin A1c measurement - POCT urinalysis dipstick  Elevated LDL cholesterol level - Hemoglobin A1c  Essential hypertension - CBC with Differential - Comprehensive metabolic panel - POCT UA - Microscopic Only  Need for hepatitis C screening test -     Hep C antibody  Influenza vaccine needed - Flu Vaccine QUAD 36+ mos IM  Need for prophylactic vaccination with combined diphtheria-tetanus-pertussis (DTP) vaccine - Tdap vaccine greater than or equal to 7yo IM  Environmental and seasonal allergies - azelastine (ASTELIN) 0.1 % nasal spray; Place 2 sprays into both nostrils 2 (two) times daily. Use in each nostril as directed -     Continue using Allegra qhs -     Continue Flonase qd  Screening for Breast Cancer -      Amb referral for mommogram     The patient was instructed to to call or comeback to clinic as needed, or should symptoms warrant.  Deliah BostonMichael Clark, MHS, PA-C [facility] 02/07/2014 9:57 AM

## 2014-02-07 NOTE — Progress Notes (Signed)
Agree with A/P. Dr Geovany Trudo 

## 2014-04-18 ENCOUNTER — Encounter: Payer: Self-pay | Admitting: Family Medicine

## 2014-04-18 ENCOUNTER — Ambulatory Visit (INDEPENDENT_AMBULATORY_CARE_PROVIDER_SITE_OTHER): Payer: BLUE CROSS/BLUE SHIELD | Admitting: Family Medicine

## 2014-04-18 VITALS — BP 130/90 | HR 72 | Temp 97.9°F | Resp 16 | Ht 63.75 in | Wt 182.2 lb

## 2014-04-18 DIAGNOSIS — G4700 Insomnia, unspecified: Secondary | ICD-10-CM

## 2014-04-18 DIAGNOSIS — I10 Essential (primary) hypertension: Secondary | ICD-10-CM

## 2014-04-18 DIAGNOSIS — E119 Type 2 diabetes mellitus without complications: Secondary | ICD-10-CM

## 2014-04-18 DIAGNOSIS — R5383 Other fatigue: Secondary | ICD-10-CM

## 2014-04-18 DIAGNOSIS — E559 Vitamin D deficiency, unspecified: Secondary | ICD-10-CM

## 2014-04-18 DIAGNOSIS — E785 Hyperlipidemia, unspecified: Secondary | ICD-10-CM

## 2014-04-18 LAB — LIPID PANEL
Cholesterol: 222 mg/dL — ABNORMAL HIGH (ref 0–200)
HDL: 71 mg/dL (ref >39)
LDL Cholesterol: 139 mg/dL — ABNORMAL HIGH (ref 0–99)
Total CHOL/HDL Ratio: 3.1 ratio
Triglycerides: 58 mg/dL (ref <150)
VLDL: 12 mg/dL (ref 0–40)

## 2014-04-18 LAB — COMPLETE METABOLIC PANEL WITH GFR
AST: 23 U/L (ref 0–37)
Albumin: 3.9 g/dL (ref 3.5–5.2)
Alkaline Phosphatase: 55 U/L (ref 39–117)
BUN: 20 mg/dL (ref 6–23)
CO2: 28 mEq/L (ref 19–32)
Chloride: 101 mEq/L (ref 96–112)
GFR, Est African American: 81 mL/min
Potassium: 4.5 mEq/L (ref 3.5–5.3)
Total Bilirubin: 0.7 mg/dL (ref 0.2–1.2)
Total Protein: 6.9 g/dL (ref 6.0–8.3)

## 2014-04-18 LAB — COMPLETE METABOLIC PANEL WITHOUT GFR
ALT: 19 U/L (ref 0–35)
Calcium: 9.6 mg/dL (ref 8.4–10.5)
Creat: 0.89 mg/dL (ref 0.50–1.10)
GFR, Est Non African American: 70 mL/min
Glucose, Bld: 119 mg/dL — ABNORMAL HIGH (ref 70–99)
Sodium: 137 meq/L (ref 135–145)

## 2014-04-18 LAB — TSH: TSH: 1.673 u[IU]/mL (ref 0.350–4.500)

## 2014-04-18 LAB — POCT GLYCOSYLATED HEMOGLOBIN (HGB A1C): Hemoglobin A1C: 6.5

## 2014-04-18 MED ORDER — LORAZEPAM 0.5 MG PO TABS: ORAL_TABLET | ORAL | Status: DC

## 2014-04-18 MED ORDER — VITAMIN D (ERGOCALCIFEROL) 1.25 MG (50000 UNIT) PO CAPS: 50000.0000 [IU] | ORAL_CAPSULE | ORAL | Status: DC

## 2014-04-18 MED ORDER — LORAZEPAM 0.5 MG PO TABS
ORAL_TABLET | ORAL | Status: DC
Start: 2014-04-18 — End: 2014-04-18

## 2014-04-18 NOTE — Progress Notes (Signed)
 Chief Complaint:  Chief Complaint  Patient presents with  . Follow-up    6 mos  . Hypertension  . Hyperlipidemia  . Medication Refill    Vitamin D    HPI: Cynthia Stewart is a 64 y.o. female who is here for fatigue and a recheck of her cholesterol , DM and HTN She has other issues that she would like to talk about as well such as her insomnia Has not had a lot of sleep , she is very busy at work and the work keeps piling on since she is doing 3 jobs in 1, she does auto claims She is so tired she does not want to exercise, but she is stressed and can;t sleep, she is hoping things will change in April when she gets a Writer, the current one does not want to do anything. To make matters worse She has hot flashes and also husband is having his own foot problems and is getting up and down for his work  She does not take trazadone anymore because it did not work She usually takes melatonin, it does not carry her through the night,  She has DM and HTN, denies SEs, is compliant, deneis hypoglycemia. Denies Chest pain, dizziness, SOB Her BP at home 130/70 s   BP Readings from Last 3 Encounters:  04/18/14 130/90  02/07/14 128/76  01/15/14 134/82   Wt Readings from Last 3 Encounters:  04/18/14 182 lb 3.2 oz (82.645 kg)  02/07/14 178 lb 9.6 oz (81.012 kg)  01/15/14 179 lb 6.4 oz (81.375 kg)      Past Medical History  Diagnosis Date  . Allergy   . Hypertension   . Hyperlipidemia    Past Surgical History  Procedure Laterality Date  . Abdominal hysterectomy    . Appendectomy    . Cholecystectomy     History   Social History  . Marital Status: Married    Spouse Name: N/A    Number of Children: N/A  . 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 Smoking status: Never Smoker   . Smokeless tobacco: Never Used  . Alcohol Use: No  . Drug Use: None  . Sexual Activity: Yes   Other Topics Concern  . None    Social History Narrative   Married. Education: [employer]. Exercise: No.   Family History  Problem Relation Age of Onset  . Hypertension Mother   . Diabetes Mother   . Hyperlipidemia Mother   . Hyperlipidemia Sister   . Hypertension Sister   . Hypertension Brother    Allergies  Allergen Reactions  . Codeine Other (See Comments)    HALLUCINATION  . Percocet [Oxycodone-Acetaminophen] Itching and Other (See Comments)    HALLUCINATION   Prior to Admission medications   Medication Sig Start Date End Date Taking? Authorizing Provider  azelastine (ASTELIN) 0.1 % nasal spray Place 2 sprays into both nostrils 2 (two) times daily. Use in each nostril as directed 02/07/14  Yes Dolores Lory, PA-C  Ergocalciferol (VITAMIN D2 PO) Take by mouth 2 (two) times a week.   Yes Historical Provider, MD  estradiol (VIVELLE-DOT) 0.05 MG/24HR patch Place 1 patch (0.05 mg total) onto the skin 2 (two) times a week. 10/11/13  Yes  P , DO  rosuvastatin (CRESTOR) 5 MG tablet Take 1 tablet (5 mg total) by mouth at bedtime. 10/11/13  Yes   P , DO  valsartan-hydrochlorothiazide (DIOVAN-HCT) 320-12.5 MG per tablet Take 1 tablet by mouth daily. 10/11/13  Yes  P , DO  traZODone (DESYREL) 50 MG tablet Take 0.5-1 tablets (25-50 mg total) by mouth at bedtime as needed for sleep. Patient not taking: Reported on 04/18/2014 10/11/13    P , DO  zoster vaccine live, PF, (ZOSTAVAX) 40981 UNT/0.65ML injection Inject 19,400 Units into the skin once. Patient not taking: Reported on 04/18/2014 02/07/14    P , DO  zoster vaccine live, PF, (ZOSTAVAX) 19147 UNT/0.65ML injection Inject 19,400 Units into the skin once. Patient not taking: Reported on 04/18/2014 02/07/14   Dolores Lory, PA-C     ROS: The patient denies fevers, chills, night sweats, unintentional weight loss, chest pain, palpitations, wheezing, dyspnea on exertion, nausea, vomiting, abdominal pain, dysuria, hematuria, melena,  numbness, weakness, or tingling.  All other systems have been reviewed and were otherwise negative with the exception of those mentioned in the HPI and as above.    PHYSICAL EXAM: Filed Vitals:   04/18/14 0818  BP: 130/90  Pulse: 72  Temp: 97.9 F (36.6 C)  Resp: 16   Filed Vitals:   04/18/14 0818  Height: 5' 3.75" (1.619 m)  Weight: 182 lb 3.2 oz (82.645 kg)   Body mass index is 31.53 kg/(m^2).  General: Alert, no acute distress HEENT:  Normocephalic, atraumatic, oropharynx patent. EOMI, PERRLA, no thyroidmegaly, fundo exam nl Cardiovascular:  Regular rate and rhythm, no rubs murmurs or gallops.  No Carotid bruits, radial pulse intact. No pedal edema.  Respiratory: Clear to auscultation bilaterally.  No wheezes, rales, or rhonchi.  No cyanosis, no use of accessory musculature GI: No organomegaly, abdomen is soft and non-tender, positive bowel sounds.  No masses. Skin: No rashes. Neurologic: Facial musculature symmetric. Normal microfilament exam bilaterally Psychiatric: Patient is appropriate throughout our interaction. Lymphatic: No cervical lymphadenopathy Musculoskeletal: Gait intact. 5/5 strength   LABS: Results for orders placed or performed in visit on 04/18/14  COMPLETE METABOLIC PANEL WITH GFR  Result Value Ref Range   Sodium 137 135 - 145 mEq/L   Potassium 4.5 3.5 - 5.3 mEq/L   Chloride 101 96 - 112 mEq/L   CO2 28 19 - 32 mEq/L   Glucose, Bld 119 (H) 70 - 99 mg/dL   BUN 20 6 - 23 mg/dL   Creat 8.29 5.62 - 1.30 mg/dL   Total Bilirubin 0.7 0.2 - 1.2 mg/dL   Alkaline Phosphatase 55 39 - 117 U/L   AST 23 0 - 37 U/L   ALT 19 0 - 35 U/L   Total Protein 6.9 6.0 - 8.3 g/dL   Albumin 3.9 3.5 - 5.2 g/dL   Calcium 9.6 8.4 - 86.5 mg/dL   GFR, Est African American 81 mL/min   GFR, Est Non African American 70 mL/min  TSH  Result Value Ref Range   TSH 1.673 0.350 - 4.500 uIU/mL  Vitamin D, 25-hydroxy  Result Value Ref Range   Vit D, 25-Hydroxy 17 (L) 30 - 100 ng/mL   Lipid panel  Result Value Ref Range   Cholesterol 222 (H) 0 - 200 mg/dL   Triglycerides 58 <784 mg/dL   HDL 71 >69 mg/dL   Total CHOL/HDL Ratio 3.1 Ratio   VLDL 12 0 - 40 mg/dL   LDL Cholesterol 629 (H) 0 - 99 mg/dL  POCT glycosylated hemoglobin (Hb A1C)  Result Value Ref Range   Hemoglobin A1C 6.5      EKG/XRAY:  [facility]   ASSESSMENT/PLAN: Encounter Diagnoses  Name Primary?  . Essential hypertension Yes  . Type 2 diabetes mellitus without complication   . Hyperlipidemia   . Insomnia   . Vitamin D deficiency   . Other fatigue    Labs pending She wants to try diet and exercise for her DM She will try increasing her soy intake ie protein, shakes etc to see if it helps with her hotflashes, already on vivelle Cont with current meds for cholesterol and HTN I have given her ativan to see if it helps with stress level and sleep. F/u in 3 months  Recommend : ADA diet, BP goal <140/90, daily foot exams, tobacco cessation if smoking, annual eye exam, annual flu vaccine, PNA vaccine if age and time appropriate.   Gross sideeffects, risk and benefits, and alternatives of medications d/w patient. Patient is aware that all medications have potential sideeffects and we are unable to predict every sideeffect or drug-drug interaction that may occur.  ,  PHUONG, DO 04/22/2014 11:39 AM

## 2014-04-19 LAB — VITAMIN D 25 HYDROXY (VIT D DEFICIENCY, FRACTURES): Vit D, 25-Hydroxy: 17 ng/mL — ABNORMAL LOW (ref 30–100)

## 2014-05-21 ENCOUNTER — Ambulatory Visit (INDEPENDENT_AMBULATORY_CARE_PROVIDER_SITE_OTHER): Payer: BLUE CROSS/BLUE SHIELD | Admitting: Family Medicine

## 2014-05-21 VITALS — BP 138/80 | HR 88 | Temp 98.0°F | Resp 16 | Ht 64.0 in | Wt 175.0 lb

## 2014-05-21 DIAGNOSIS — B349 Viral infection, unspecified: Secondary | ICD-10-CM

## 2014-05-21 DIAGNOSIS — R51 Headache: Secondary | ICD-10-CM | POA: Diagnosis not present

## 2014-05-21 DIAGNOSIS — E860 Dehydration: Secondary | ICD-10-CM | POA: Diagnosis not present

## 2014-05-21 DIAGNOSIS — R197 Diarrhea, unspecified: Secondary | ICD-10-CM

## 2014-05-21 DIAGNOSIS — Z8639 Personal history of other endocrine, nutritional and metabolic disease: Secondary | ICD-10-CM | POA: Diagnosis not present

## 2014-05-21 DIAGNOSIS — R519 Headache, unspecified: Secondary | ICD-10-CM

## 2014-05-21 DIAGNOSIS — R112 Nausea with vomiting, unspecified: Secondary | ICD-10-CM | POA: Diagnosis not present

## 2014-05-21 LAB — POCT URINALYSIS DIPSTICK
Glucose, UA: NEGATIVE
KETONES UA: NEGATIVE
LEUKOCYTES UA: NEGATIVE
NITRITE UA: NEGATIVE
PH UA: 5.5
PROTEIN UA: 30
Spec Grav, UA: >=1.03
Urobilinogen, UA: 0.2

## 2014-05-21 LAB — POCT CBC
GRANULOCYTE PERCENT: 73.4 % (ref 37–80)
HCT, POC: 47.7 % (ref 37.7–47.9)
Hemoglobin: 15.7 g/dL (ref 12.2–16.2)
LYMPH, POC: 1.7 (ref 0.6–3.4)
MCH, POC: 28.8 pg (ref 27–31.2)
MCHC: 32.9 g/dL (ref 31.8–35.4)
MCV: 87.7 fL (ref 80–97)
MID (cbc): 0.1 (ref 0–0.9)
MPV: 9.4 fL (ref 0–99.8)
PLATELET COUNT, POC: 201 10*3/uL (ref 142–424)
POC Granulocyte: 5 (ref 2–6.9)
POC LYMPH PERCENT: 24.7 %L (ref 10–50)
POC MID %: 1.9 %M (ref 0–12)
RBC: 5.44 M/uL (ref 4.04–5.48)
RDW, POC: 13.9 %
WBC: 6.8 10*3/uL (ref 4.6–10.2)

## 2014-05-21 LAB — POCT UA - MICROSCOPIC ONLY
Bacteria, U Microscopic: NEGATIVE
Casts, Ur, LPF, POC: NEGATIVE
Crystals, Ur, HPF, POC: NEGATIVE
Mucus, UA: NEGATIVE
YEAST UA: NEGATIVE

## 2014-05-21 LAB — GLUCOSE, POCT (MANUAL RESULT ENTRY): POC Glucose: 125 mg/dl — AB (ref 70–99)

## 2014-05-21 MED ORDER — ONDANSETRON 4 MG PO TBDP: ORAL_TABLET | ORAL | Status: DC

## 2014-05-21 MED ORDER — ONDANSETRON 4 MG PO TBDP
4.0000 mg | ORAL_TABLET | Freq: Once | ORAL | Status: AC
  Administered 2014-05-21: 4 mg via ORAL

## 2014-05-21 NOTE — Patient Instructions (Signed)
Drink plenty of fluids. Gradually advance diet as discussed.  Take ondansetron one every 4-6 hours if needed for nausea or vomiting  Return at anytime if worse or go to the emergency room if severe abdominal pain or severely worsening symptoms

## 2014-05-21 NOTE — Progress Notes (Signed)
Subjective: 64 year old lady who started getting ill on Sunday. Monday she felt queasy all day. Yesterday she started vomiting and has persisted in doing that. She last vomited this morning. She's not been able to take anything by mouth because she just feels so bad. She has a headache and feels achy. She said she had a temperature of which the last 101 last night. She had her first diarrheal L movement this morning. She is not constipated much. She does not usually have gastrointestinal problems. She did not go to work Monday because she is feeling bad. Yesterday she attempted going for a little while, but left early because she was feeling too bad. She does not have much abdominal pain, primarily just the nausea. No dysuria.  Objective: She appears ill. She is laying on the exam table preferred to keep her eyes closed. Her TMs are normal. Throat clear. Neck supple without nodes. Chest is clear to auscultation. Heart regular without murmurs. Has bowel sounds. Soft masses or tenderness. Does just generally feel bad though. She last urinated when she came to the office this morning.  Assessment: Nausea and vomiting Headache History of fever Probable viral illness, but need to check a couple things line history of hyperglycemia  Plan: Check labs  Results for orders placed or performed in visit on 05/21/14  POCT CBC  Result Value Ref Range   WBC 6.8 4.6 - 10.2 K/uL   Lymph, poc 1.7 0.6 - 3.4   POC LYMPH PERCENT 24.7 10 - 50 %L   MID (cbc) 0.1 0 - 0.9   POC MID % 1.9 0 - 12 %M   POC Granulocyte 5.0 2 - 6.9   Granulocyte percent 73.4 37 - 80 %G   RBC 5.44 4.04 - 5.48 M/uL   Hemoglobin 15.7 12.2 - 16.2 g/dL   HCT, POC 81.147.7 91.437.7 - 47.9 %   MCV 87.7 80 - 97 fL   MCH, POC 28.8 27 - 31.2 pg   MCHC 32.9 31.8 - 35.4 g/dL   RDW, POC 78.213.9 %   Platelet Count, POC 201 142 - 424 K/uL   MPV 9.4 0 - 99.8 fL  POCT glucose (manual entry)  Result Value Ref Range   POC Glucose 125 (A) 70 - 99 mg/dl   POCT UA - Microscopic Only  Result Value Ref Range   WBC, Ur, HPF, POC 0-1    RBC, urine, microscopic 2-3    Bacteria, U Microscopic neg    Mucus, UA neg    Epithelial cells, urine per micros 0-1    Crystals, Ur, HPF, POC neg    Casts, Ur, LPF, POC neg    Yeast, UA neg   POCT urinalysis dipstick  Result Value Ref Range   Color, UA yellow    Clarity, UA clear    Glucose, UA neg    Bilirubin, UA smal    Ketones, UA neg    Spec Grav, UA >=1.030    Blood, UA moderate    pH, UA 5.5    Protein, UA 30    Urobilinogen, UA 0.2    Nitrite, UA neg    Leukocytes, UA Negative

## 2014-06-23 ENCOUNTER — Other Ambulatory Visit: Payer: Self-pay | Admitting: Family Medicine

## 2014-06-25 ENCOUNTER — Other Ambulatory Visit: Payer: Self-pay | Admitting: Family Medicine

## 2014-06-30 NOTE — Telephone Encounter (Signed)
Called in.

## 2014-07-18 ENCOUNTER — Ambulatory Visit (INDEPENDENT_AMBULATORY_CARE_PROVIDER_SITE_OTHER): Payer: BLUE CROSS/BLUE SHIELD | Admitting: Family Medicine

## 2014-07-18 ENCOUNTER — Encounter: Payer: Self-pay | Admitting: Family Medicine

## 2014-07-18 VITALS — BP 112/78 | HR 72 | Temp 97.7°F | Resp 18 | Ht 64.0 in | Wt 180.0 lb

## 2014-07-18 DIAGNOSIS — I10 Essential (primary) hypertension: Secondary | ICD-10-CM | POA: Diagnosis not present

## 2014-07-18 DIAGNOSIS — E785 Hyperlipidemia, unspecified: Secondary | ICD-10-CM | POA: Diagnosis not present

## 2014-07-18 DIAGNOSIS — E559 Vitamin D deficiency, unspecified: Secondary | ICD-10-CM

## 2014-07-18 DIAGNOSIS — E119 Type 2 diabetes mellitus without complications: Secondary | ICD-10-CM | POA: Diagnosis not present

## 2014-07-18 DIAGNOSIS — J0100 Acute maxillary sinusitis, unspecified: Secondary | ICD-10-CM

## 2014-07-18 LAB — COMPLETE METABOLIC PANEL WITH GFR
ALT: 19 U/L (ref 0–35)
Albumin: 4.2 g/dL (ref 3.5–5.2)
BUN: 15 mg/dL (ref 6–23)
CO2: 27 mEq/L (ref 19–32)
Chloride: 99 mEq/L (ref 96–112)
Creat: 0.86 mg/dL (ref 0.50–1.10)
GFR, Est African American: 84 mL/min
GFR, Est Non African American: 73 mL/min
Glucose, Bld: 101 mg/dL — ABNORMAL HIGH (ref 70–99)
Potassium: 3.9 mEq/L (ref 3.5–5.3)
Sodium: 136 mEq/L (ref 135–145)
Total Protein: 7.6 g/dL (ref 6.0–8.3)

## 2014-07-18 LAB — COMPLETE METABOLIC PANEL WITHOUT GFR
AST: 20 U/L (ref 0–37)
Alkaline Phosphatase: 58 U/L (ref 39–117)
Calcium: 9.8 mg/dL (ref 8.4–10.5)
Total Bilirubin: 0.7 mg/dL (ref 0.2–1.2)

## 2014-07-18 LAB — LIPID PANEL
Cholesterol: 257 mg/dL — ABNORMAL HIGH (ref 0–200)
HDL: 79 mg/dL (ref >=46)
LDL Cholesterol: 161 mg/dL — ABNORMAL HIGH (ref 0–99)
Total CHOL/HDL Ratio: 3.3 Ratio
Triglycerides: 85 mg/dL (ref <150)
VLDL: 17 mg/dL (ref 0–40)

## 2014-07-18 LAB — POCT GLYCOSYLATED HEMOGLOBIN (HGB A1C): Hemoglobin A1C: 6.6

## 2014-07-18 LAB — TSH: TSH: 0.82 u[IU]/mL (ref 0.350–4.500)

## 2014-07-18 MED ORDER — VITAMIN D (ERGOCALCIFEROL) 1.25 MG (50000 UNIT) PO CAPS: 50000.0000 [IU] | ORAL_CAPSULE | ORAL | Status: DC

## 2014-07-18 MED ORDER — AMOXICILLIN-POT CLAVULANATE 875-125 MG PO TABS: 1.0000 | ORAL_TABLET | Freq: Two times a day (BID) | ORAL | Status: DC

## 2014-07-18 MED ORDER — DIOVAN HCT 320-12.5 MG PO TABS: 1.0000 | ORAL_TABLET | Freq: Every day | ORAL | Status: DC

## 2014-07-18 NOTE — Progress Notes (Signed)
Chief Complaint:  Chief Complaint  Patient presents with  . Follow-up    htn  . Headache    x3 weeks   . Dental Pain  . Facial Pain  . Cough    yellow mucous     HPI: Cynthia Stewart is a 64 y.o. female who is here for 3 days of acute sinusitis symptoms, cough , mucus, yellow. She was slightly better but it has gotten worse. She has facial pain, dental pain, intermittent headaches. She is still at her current job, however she is hoping that they'll be new changes.Her manager is leaving and not sure, he is worthless, she is doing more than 2 peoples job. 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 She feels that a lot of her stress and elevated blood pressure and anxiousness and difficulty sleeping is related to her job. She has HTN and is Compliant, no dizziness, no chest pin She was dizzy x1 but that was possibly related to her sinus symptoms. She did not have these before.  BP Readings from Last 3 Encounters:  07/18/14 112/78  05/21/14 138/80  04/18/14 130/90      Past Medical History  Diagnosis Date  . Allergy   . Hypertension   . Hyperlipidemia    Past Surgical History  Procedure Laterality Date  . Abdominal hysterectomy    . Appendectomy    . Cholecystectomy     History   Social History  . Marital Status: Married    Spouse Name: N/A  . Number of Children: N/A  . 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 Smoking status: Never Smoker   . Smokeless tobacco: Never Used  . Alcohol Use: No  . Drug Use: Not on file  . Sexual Activity: Yes   Other Topics Concern  . None   Social History Narrative   Married. Education: [employer]. Exercise: No.   Family History  Problem Relation Age of Onset  . Hypertension Mother   . Diabetes Mother   . Hyperlipidemia Mother   . Hyperlipidemia Sister   . Hypertension Sister   . Hypertension Brother    Allergies  Allergen Reactions  . Codeine Other (See  Comments)    HALLUCINATION  . Percocet [Oxycodone-Acetaminophen] Itching and Other (See Comments)    HALLUCINATION   Prior to Admission medications   Medication Sig Start Date End Date Taking? Authorizing Provider  azelastine (ASTELIN) 0.1 % nasal spray Place 2 sprays into both nostrils 2 (two) times daily. Use in each nostril as directed 02/07/14  Yes Ofilia NeasMichael L Clark, PA-C  DIOVAN HCT 320-12.5 MG per tablet TAKE 1 TABLET BY MOUTH EVERY DAY 06/23/14  Yes Myrth Dahan P Katessa Attridge, DO  estradiol (VIVELLE-DOT) 0.05 MG/24HR patch Place 1 patch (0.05 mg total) onto the skin 2 (two) times a week. 10/11/13  Yes Kasandra Fehr P Orene Abbasi, DO  LORazepam (ATIVAN) 0.5 MG tablet TAKE 1/2 TABLET BY MOUTH AT BEDTIME AS NEEDED FOR INSOMNIA/STRESS 06/26/14  Yes Anjolina Byrer P Markeis Allman, DO  rosuvastatin (CRESTOR) 5 MG tablet Take 1 tablet (5 mg total) by mouth at bedtime. 10/11/13  Yes Legna Mausolf P Ege Muckey, DO  Vitamin D, Ergocalciferol, (DRISDOL) 50000 UNITS CAPS capsule Take 1 capsule (50,000 Units total) by mouth every 7 (seven) days. 04/18/14  Yes Willette Mudry P Neziah Vogelgesang, DO     ROS: The patient denies fevers, chills, night sweats, unintentional weight loss, chest pain, palpitations,  wheezing, dyspnea on exertion, nausea, vomiting, abdominal pain, dysuria, hematuria, melena, numbness, weakness, or tingling.   All other systems have been reviewed and were otherwise negative with the exception of those mentioned in the HPI and as above.    PHYSICAL EXAM: Filed Vitals:   07/18/14 1110  BP: 112/78  Pulse: 72  Temp: 97.7 F (36.5 C)  Resp: 18   Filed Vitals:   07/18/14 1110  Height:  (1.626 m)  Weight: 180 lb (81.647 kg)   Body mass index is 30.88 kg/(m^2).  General: Alert, no acute distress HEENT:  Normocephalic, atraumatic, oropharynx patent. EOMI, PERRLA,  Erythematous throat, no exudates, TM normal, + sinus tenderness, + erythematous/boggy nasal mucosa Cardiovascular:  Regular rate and rhythm, no rubs murmurs or gallops.  No Carotid bruits, radial pulse intact.  No pedal edema.  Respiratory: Clear to auscultation bilaterally.  No wheezes, rales, or rhonchi.  No cyanosis, no use of accessory musculature GI: No organomegaly, abdomen is soft and non-tender, positive bowel sounds.  No masses. Skin: No rashes. Neurologic: Facial musculature symmetric. Psychiatric: Patient is appropriate throughout our interaction. Lymphatic: No cervical lymphadenopathy Musculoskeletal: Gait intact.   LABS: Results for orders placed or performed in visit on 05/21/14  POCT CBC  Result Value Ref Range   WBC 6.8 4.6 - 10.2 K/uL   Lymph, poc 1.7 0.6 - 3.4   POC LYMPH PERCENT 24.7 10 - 50 %L   MID (cbc) 0.1 0 - 0.9   POC MID % 1.9 0 - 12 %M   POC Granulocyte 5.0 2 - 6.9   Granulocyte percent 73.4 37 - 80 %G   RBC 5.44 4.04 - 5.48 M/uL   Hemoglobin 15.7 12.2 - 16.2 g/dL   HCT, POC 16.1 09.6 - 47.9 %   MCV 87.7 80 - 97 fL   MCH, POC 28.8 27 - 31.2 pg   MCHC 32.9 31.8 - 35.4 g/dL   RDW, POC 04.5 %   Platelet Count, POC 201 142 - 424 K/uL   MPV 9.4 0 - 99.8 fL  POCT glucose (manual entry)  Result Value Ref Range   POC Glucose 125 (A) 70 - 99 mg/dl  POCT UA - Microscopic Only  Result Value Ref Range   WBC, Ur, HPF, POC 0-1    RBC, urine, microscopic 2-3    Bacteria, U Microscopic neg    Mucus, UA neg    Epithelial cells, urine per micros 0-1    Crystals, Ur, HPF, POC neg    Casts, Ur, LPF, POC neg    Yeast, UA neg   POCT urinalysis dipstick  Result Value Ref Range   Color, UA yellow    Clarity, UA clear    Glucose, UA neg    Bilirubin, UA smal    Ketones, UA neg    Spec Grav, UA >=1.030    Blood, UA moderate    pH, UA 5.5    Protein, UA 30    Urobilinogen, UA 0.2    Nitrite, UA neg    Leukocytes, UA Negative      EKG/XRAY:   Primary read interpreted by Dr. Conley Rolls at [facility].   ASSESSMENT/PLAN: Encounter Diagnoses  Name Primary?  . Essential hypertension Yes  . Acute maxillary sinusitis, recurrence not specified   . Hyperlipidemia   . Vitamin D  deficiency   . Type 2 diabetes mellitus without complication    Diovan refill for hypertension, printed prescription given and she will take it to [facility] or  some other place where it might be cheaper than her current pharmacy I told the patient that we can go ahead and rx antivan prn if she calls by phone for 1 month with 1 refill Labs pending Fu in 3-6 months  Gross sideeffects, risk and benefits, and alternatives of medications d/w patient. Patient is aware that all medications have potential sideeffects and we are unable to predict every sideeffect or drug-drug interaction that may occur.  Anjelica Gorniak PHUONG, DO 07/18/2014 11:58 AM

## 2014-07-19 LAB — MICROALBUMIN, URINE: Microalb, Ur: <0.2 mg/dL (ref <2.0)

## 2014-07-19 LAB — VITAMIN D 25 HYDROXY (VIT D DEFICIENCY, FRACTURES): Vit D, 25-Hydroxy: 23 ng/mL — ABNORMAL LOW (ref 30–100)

## 2014-08-14 ENCOUNTER — Telehealth: Payer: Self-pay

## 2014-08-14 DIAGNOSIS — J0100 Acute maxillary sinusitis, unspecified: Secondary | ICD-10-CM

## 2014-08-14 NOTE — Telephone Encounter (Signed)
amoxicillin-clavulanate (AUGMENTIN) 875-125 MG per tablet 20 tablet 0 07/18/2014      Take 1 tablet by mouth 2 (two) times daily. - Oral    DIOVAN HCT 320-12.5 MG per tablet 30 tablet 5 07/18/2014     Take 1 tablet by mouth daily. - Oral    Vitamin D, Ergocalciferol, (DRISDOL) 50000 UNITS CAPS capsule 30 capsule 3 07/18/2014     Take 1 capsule (50,000 Units total) by mouth every 7 (seven) days. - Oral

## 2014-08-14 NOTE — Telephone Encounter (Signed)
Dr. Conley RollsLe  [facility] [address]    Still has all the symptons -  Asking for more medication. Please call patient and advise if something was called into pharmacy   [phone]

## 2014-08-15 MED ORDER — CEFDINIR 300 MG PO CAPS: 300.0000 mg | ORAL_CAPSULE | Freq: Two times a day (BID) | ORAL | Status: DC

## 2014-08-15 NOTE — Telephone Encounter (Signed)
Will rx omnicef, dig get some relif with augmentin but still has residual sxs and causing headache and facial pain, on zyrtec, allegra and steroid nasal spray

## 2014-09-11 ENCOUNTER — Encounter: Payer: Self-pay | Admitting: *Deleted

## 2014-10-22 ENCOUNTER — Other Ambulatory Visit: Payer: Self-pay | Admitting: Family Medicine

## 2014-10-23 NOTE — Telephone Encounter (Signed)
Dr Conley Rolls, you saw pt for check up in April and wanted her to RTC in 3-6 mos. I don't see this med discussed though. OK to RF?

## 2014-10-29 ENCOUNTER — Other Ambulatory Visit: Payer: Self-pay | Admitting: Family Medicine

## 2014-12-17 ENCOUNTER — Ambulatory Visit (INDEPENDENT_AMBULATORY_CARE_PROVIDER_SITE_OTHER): Payer: BLUE CROSS/BLUE SHIELD | Admitting: Physician Assistant

## 2014-12-17 VITALS — BP 132/70 | HR 77 | Temp 98.2°F | Resp 18 | Ht 65.0 in | Wt 181.0 lb

## 2014-12-17 DIAGNOSIS — J324 Chronic pansinusitis: Secondary | ICD-10-CM

## 2014-12-17 DIAGNOSIS — T3695XA Adverse effect of unspecified systemic antibiotic, initial encounter: Secondary | ICD-10-CM

## 2014-12-17 DIAGNOSIS — R42 Dizziness and giddiness: Secondary | ICD-10-CM

## 2014-12-17 DIAGNOSIS — B379 Candidiasis, unspecified: Secondary | ICD-10-CM

## 2014-12-17 MED ORDER — GUAIFENESIN ER 1200 MG PO TB12: 1.0000 | ORAL_TABLET | Freq: Two times a day (BID) | ORAL | Status: DC | PRN

## 2014-12-17 MED ORDER — DOXYCYCLINE HYCLATE 100 MG PO CAPS: 100.0000 mg | ORAL_CAPSULE | Freq: Two times a day (BID) | ORAL | Status: AC

## 2014-12-17 MED ORDER — FLUCONAZOLE 150 MG PO TABS: 150.0000 mg | ORAL_TABLET | Freq: Once | ORAL | Status: DC

## 2014-12-17 MED ORDER — MECLIZINE HCL 25 MG PO TABS: 25.0000 mg | ORAL_TABLET | Freq: Three times a day (TID) | ORAL | Status: AC | PRN

## 2014-12-17 NOTE — Patient Instructions (Signed)

## 2014-12-18 NOTE — Progress Notes (Signed)
[facility] [address], [place] [address] [id number] [phone]  Date:  12/17/2014   Name:  Cynthia Stewart   DOB:  [birth year]   MRN:  [id number]  PCP:  Primus Bravo, NP    History of Present Illness:  Cynthia Stewart is a 64 y.o. female patient who presents to [facility] for chief complaint of 4 weeks of nasal congestion and post nasal drip that has progressively worsened to mouth pain and dizziness over the last week.  She states that she has felt malaise and dysequilibrium with walking.  She will have a bad taste in mouth.  She denies vision changes.  She has some discomfort in her ears.  Mucus is not thick.  When she lays down she feels the drainage to the back of her throat that sends her coughing.  She has tried nasal spray, zyrtec, but has avoided sudafed, due to onset of palpations.    Patient Active Problem List   Diagnosis Date Noted  . Elevated hemoglobin A1c measurement 02/07/2014  . Elevated LDL cholesterol level 02/07/2014  . Environmental and seasonal allergies 02/07/2014  . Essential hypertension 01/15/2014    Past Medical History  Diagnosis Date  . Allergy   . Hypertension   . Hyperlipidemia     Past Surgical History  Procedure Laterality Date  . Abdominal hysterectomy    . Appendectomy    . Cholecystectomy      Social History  Substance Use Topics  . Smoking status: Never Smoker   . Smokeless tobacco: Never Used  . Alcohol Use: No    Family History  Problem Relation Age of Onset  . Hypertension Mother   . Diabetes Mother   . Hyperlipidemia Mother   . Hyperlipidemia Sister   . Hypertension Sister   . Hypertension Brother     Allergies  Allergen Reactions  . Codeine Other (See Comments)    HALLUCINATION  . Percocet [Oxycodone-Acetaminophen] Itching and Other (See Comments)    HALLUCINATION    Medication list has been reviewed and updated.  Current Outpatient Prescriptions on File Prior to Visit  Medication Sig Dispense  Refill  . azelastine (ASTELIN) 0.1 % nasal spray Place 2 sprays into both nostrils 2 (two) times daily. Use in each nostril as directed 30 mL 12  . cefdinir (OMNICEF) 300 MG capsule Take 1 capsule (300 mg total) by mouth 2 (two) times daily. 20 capsule 0  . DIOVAN HCT 320-12.5 MG per tablet Take 1 tablet by mouth daily. 30 tablet 5  . estradiol (VIVELLE-DOT) 0.05 MG/24HR patch UNWRAP AND APPLY 1 PATCH ONTO THE SKIN TWO TIMES A WEEK 8 patch 1  . rosuvastatin (CRESTOR) 5 MG tablet TAKE 1 TABLET BY MOUTH AT BEDTIME.  "OV NEEDED" 30 tablet 0  . Vitamin D, Ergocalciferol, (DRISDOL) 50000 UNITS CAPS capsule Take 1 capsule (50,000 Units total) by mouth every 7 (seven) days. 30 capsule 3  . amoxicillin-clavulanate (AUGMENTIN) 875-125 MG per tablet Take 1 tablet by mouth 2 (two) times daily. (Patient not taking: Reported on 12/17/2014) 20 tablet 0  . LORazepam (ATIVAN) 0.5 MG tablet TAKE 1/2 TABLET BY MOUTH AT BEDTIME AS NEEDED FOR INSOMNIA/STRESS (Patient not taking: Reported on 12/17/2014) 30 tablet 0   No current facility-administered medications on file prior to visit.    ROS ROS otherwise unremarkable unless listed above.   Physical Examination: BP 132/70 mmHg  Pulse 77  Temp(Src) 98.2 F (36.8 C) (Oral)  Resp 18  Ht  (1.651 m)  Wt 181 lb (82.101 kg)  BMI 30.12 kg/m2  SpO2 98% Ideal Body Weight: Weight in (lb) to have BMI = 25: 149.9  Physical Exam  Constitutional: She is oriented to person, place, and time. She appears well-developed and well-nourished. No distress.  HENT:  Head: Normocephalic and atraumatic.  Right Ear: Tympanic membrane, external ear and ear canal normal.  Left Ear: Tympanic membrane, external ear and ear canal normal.  Nose: Mucosal edema and rhinorrhea present. Right sinus exhibits maxillary sinus tenderness and frontal sinus tenderness. Left sinus exhibits maxillary sinus tenderness and frontal sinus tenderness.  Mouth/Throat: No uvula swelling. No  oropharyngeal exudate, posterior oropharyngeal edema or posterior oropharyngeal erythema.  Eyes: Conjunctivae and EOM are normal. Pupils are equal, round, and reactive to light.  Cardiovascular: Normal rate and regular rhythm.  Exam reveals no gallop, no distant heart sounds and no friction rub.   No murmur heard. Pulmonary/Chest: Effort normal. No respiratory distress. She has no decreased breath sounds. She has no wheezes. She has no rhonchi.  Lymphadenopathy:       Head (right side): No submandibular, no tonsillar, no preauricular and no posterior auricular adenopathy present.       Head (left side): No submandibular, no tonsillar, no preauricular and no posterior auricular adenopathy present.  Neurological: She is alert and oriented to person, place, and time. She has normal strength. No cranial nerve deficit. She displays a negative Romberg sign. Coordination and gait normal.  Skin: She is not diaphoretic.  Psychiatric: She has a normal mood and affect. Her behavior is normal.     Assessment and Plan: 64 year old female is here today with sinus pain, and dizziness.  This appears to be sinusitis.  I am covering with doxycycline at this time.  She would like to stray away from the Augmentin due to the side effects of yeast infection and diarrhea.    1. Pansinusitis, unspecified chronicity - doxycycline (VIBRAMYCIN) 100 MG capsule; Take 1 capsule (100 mg total) by mouth 2 (two) times daily.  Dispense: 20 capsule; Refill: 0 - Guaifenesin (MUCINEX MAXIMUM STRENGTH) 1200 MG TB12; Take 1 tablet (1,200 mg total) by mouth every 12 (twelve) hours as needed.  Dispense: 14 tablet; Refill: 1  2. Antibiotic-induced yeast infection - fluconazole (DIFLUCAN) 150 MG tablet; Take 1 tablet (150 mg total) by mouth once. Repeat if needed  Dispense: 2 tablet; Refill: 0  3. Vertigo - meclizine (ANTIVERT) 25 MG tablet; Take 1 tablet (25 mg total) by mouth 3 (three) times daily as needed for dizziness.  Dispense:  30 tablet; Refill: 0   Trena Platt, PA-C [facility] 12/18/2014 7:41 AM

## 2015-02-10 ENCOUNTER — Ambulatory Visit (INDEPENDENT_AMBULATORY_CARE_PROVIDER_SITE_OTHER): Payer: BLUE CROSS/BLUE SHIELD | Admitting: Family Medicine

## 2015-02-10 VITALS — BP 132/80 | HR 103 | Temp 97.4°F | Resp 18 | Ht 64.0 in | Wt 177.8 lb

## 2015-02-10 DIAGNOSIS — I10 Essential (primary) hypertension: Secondary | ICD-10-CM | POA: Diagnosis not present

## 2015-02-10 DIAGNOSIS — E7800 Pure hypercholesterolemia, unspecified: Secondary | ICD-10-CM

## 2015-02-10 DIAGNOSIS — Z0000 Encounter for general adult medical examination without abnormal findings: Secondary | ICD-10-CM | POA: Diagnosis not present

## 2015-02-10 DIAGNOSIS — Z1239 Encounter for other screening for malignant neoplasm of breast: Secondary | ICD-10-CM | POA: Diagnosis not present

## 2015-02-10 DIAGNOSIS — Z23 Encounter for immunization: Secondary | ICD-10-CM | POA: Diagnosis not present

## 2015-02-10 DIAGNOSIS — Z124 Encounter for screening for malignant neoplasm of cervix: Secondary | ICD-10-CM

## 2015-02-10 DIAGNOSIS — Z8639 Personal history of other endocrine, nutritional and metabolic disease: Secondary | ICD-10-CM | POA: Diagnosis not present

## 2015-02-10 DIAGNOSIS — R232 Flushing: Secondary | ICD-10-CM

## 2015-02-10 DIAGNOSIS — E559 Vitamin D deficiency, unspecified: Secondary | ICD-10-CM

## 2015-02-10 LAB — POCT CBC
Granulocyte percent: 32.1 %G — AB (ref 37–80)
HCT, POC: 44.4 % (ref 37.7–47.9)
Hemoglobin: 14.6 g/dL (ref 12.2–16.2)
Lymph, poc: 3.3 (ref 0.6–3.4)
MCH, POC: 28.4 pg (ref 27–31.2)
MCHC: 32.9 g/dL (ref 31.8–35.4)
MCV: 86.3 fL (ref 80–97)
MID (cbc): 0.4 (ref 0–0.9)
MPV: 10.7 fL (ref 0–99.8)
POC Granulocyte: 1.8 — AB (ref 2–6.9)
POC LYMPH PERCENT: 60.9 %L — AB (ref 10–50)
POC MID %: 7 % (ref 0–12)
Platelet Count, POC: 186 10*3/uL (ref 142–424)
RBC: 5.14 M/uL (ref 4.04–5.48)
RDW, POC: 14.6 %
WBC: 5.5 10*3/uL (ref 4.6–10.2)

## 2015-02-10 LAB — HEMOGLOBIN A1C: Hgb A1c MFr Bld: 7.1 % — AB (ref 4.0–6.0)

## 2015-02-10 LAB — POCT GLYCOSYLATED HEMOGLOBIN (HGB A1C): Hemoglobin A1C: 7.1

## 2015-02-10 MED ORDER — VENLAFAXINE HCL 37.5 MG PO TABS: 37.5000 mg | ORAL_TABLET | Freq: Two times a day (BID) | ORAL | Status: DC

## 2015-02-10 MED ORDER — HYDROCHLOROTHIAZIDE 12.5 MG PO TABS: 12.5000 mg | ORAL_TABLET | Freq: Every day | ORAL | Status: DC

## 2015-02-10 MED ORDER — VALSARTAN 320 MG PO TABS: 320.0000 mg | ORAL_TABLET | Freq: Every day | ORAL | Status: DC

## 2015-02-10 MED ORDER — LORAZEPAM 0.5 MG PO TABS: ORAL_TABLET | ORAL | Status: DC

## 2015-02-10 NOTE — Patient Instructions (Signed)
Valsartan tablets What is this medicine? VALSARTAN (val SAR tan) is used to treat high blood pressure. This drug is also used to treat patients with heart failure and patients who have had a heart attack. This medicine may be used for other purposes; ask your health care provider or pharmacist if you have questions. What should I tell my health care provider before I take this medicine? They need to know if you have any of these conditions: -heart failure -kidney disease -liver disease -an unusual or allergic reaction to valsartan, other medicines, foods, dyes, or preservatives -pregnant or trying to get pregnant -breast-feeding How should I use this medicine? Take this medicine by mouth with a glass of water. Follow the directions on the prescription label. This medicine can be taken with or without food. Take your medicine at regular intervals. Do not take it more often than directed. Talk to your pediatrician regarding the use of this medicine in children. While this drug may be prescribed for children as young as 6 years for selected conditions, precautions do apply. Overdosage: If you think you have taken too much of this medicine contact a poison control center or emergency room at once. NOTE: This medicine is only for you. Do not share this medicine with others. What if I miss a dose? If you miss a dose, take it as soon as you can. If it is almost time for your next dose, take only that dose. Do not take double or extra doses. What may interact with this medicine? -blood pressure medicines -lithium -diuretics, especially triamterene, spironolactone or amiloride -potassium salts or potassium supplements This list may not describe all possible interactions. Give your health care provider a list of all the medicines, herbs, non-prescription drugs, or dietary supplements you use. Also tell them if you smoke, drink alcohol, or use illegal drugs. Some items may interact with your  medicine. What should I watch for while using this medicine? Visit your doctor or health care professional for regular checks on your progress. Check your blood pressure as directed. Ask your doctor or health care professional what your blood pressure should be and when you should contact him or her. Call your doctor or health care professional if you notice an irregular or fast heart beat. Women should inform their doctor if they wish to become pregnant or think they might be pregnant. There is a potential for serious side effects to an unborn child, particularly in the second or third trimester. Talk to your health care professional or pharmacist for more information. You may get drowsy or dizzy. Do not drive, use machinery, or do anything that needs mental alertness until you know how this drug affects you. Do not stand or sit up quickly, especially if you are an older patient. This reduces the risk of dizzy or fainting spells. Alcohol can make you more drowsy and dizzy. Avoid alcoholic drinks. Avoid salt substitutes unless you are told otherwise by your doctor or health care professional. Do not treat yourself for coughs, colds, or pain while you are taking this medicine without asking your doctor or health care professional for advice. Some ingredients may increase your blood pressure. What side effects may I notice from receiving this medicine? Side effects that you should report to your doctor or health care professional as soon as possible: -confusion, dizziness, light headedness or fainting spells -decreased amount of urine passed -difficulty breathing or swallowing, hoarseness, or tightening of the throat -fast or irregular heart beat, palpitations, or chest  pain -skin rash, itching -swelling of your face, lips, tongue, hands, or feet Side effects that usually do not require medical attention (report to your doctor or health care professional if they continue or are  bothersome): -cough -decreased sexual function -headache -nausea or stomach pain This list may not describe all possible side effects. Call your doctor for medical advice about side effects. You may report side effects to FDA at 1-800-FDA-1088. Where should I keep my medicine? Keep out of the reach of children. Store at room temperature between 15 and 30 degrees C (59 and 86 degrees F). Keep your medicine container tightly closed and protect from moisture. Throw away any unused medicine after the expiration date. NOTE: This sheet is a summary. It may not cover all possible information. If you have questions about this medicine, talk to your doctor, pharmacist, or health care provider.    2016, Elsevier/Gold Standard. (2012-06-07 12:39:59) Venlafaxine tablets What is this medicine? VENLAFAXINE (VEN la fax een) is used to treat depression, anxiety and panic disorder. This medicine may be used for other purposes; ask your health care provider or pharmacist if you have questions. What should I tell my health care provider before I take this medicine? They need to know if you have any of these conditions: -bleeding disorders -glaucoma -heart disease -high blood pressure -high cholesterol -kidney disease -liver disease -low levels of sodium in the blood -mania or bipolar disorder -seizures -suicidal thoughts, plans, or attempt; a previous suicide attempt by you or a family -take medicines that treat or prevent blood clots -thyroid disease -an unusual or allergic reaction to venlafaxine, desvenlafaxine, other medicines, foods, dyes, or preservatives -pregnant or trying to get pregnant -breast-feeding How should I use this medicine? Take this medicine by mouth with a glass of water. Follow the directions on the prescription label. Take it with food. Take your medicine at regular intervals. Do not take your medicine more often than directed. Do not stop taking this medicine suddenly except  upon the advice of your doctor. Stopping this medicine too quickly may cause serious side effects or your condition may worsen. A special MedGuide will be given to you by the pharmacist with each prescription and refill. Be sure to read this information carefully each time. Talk to your pediatrician regarding the use of this medicine in children. Special care may be needed. Overdosage: If you think you have taken too much of this medicine contact a poison control center or emergency room at once. NOTE: This medicine is only for you. Do not share this medicine with others. What if I miss a dose? If you miss a dose, take it as soon as you can. If it is almost time for your next dose, take only that dose. Do not take double or extra doses. What may interact with this medicine? Do not take this medicine with any of the following medications: -certain medicines for fungal infections like fluconazole, itraconazole, ketoconazole, posaconazole, voriconazole -cisapride -desvenlafaxine -dofetilide -dronedarone -duloxetine -levomilnacipran -linezolid -MAOIs like Carbex, Eldepryl, Marplan, Nardil, and Parnate -methylene blue (injected into a vein) -milnacipran -pimozide -thioridazine -ziprasidone This medicine may also interact with the following medications: -aspirin and aspirin-like medicines -certain medicines for depression, anxiety, or psychotic disturbances -certain medicines for migraine headaches like almotriptan, eletriptan, frovatriptan, naratriptan, rizatriptan, sumatriptan, zolmitriptan -certain medicines for sleep -certain medicines that treat or prevent blood clots like dalteparin, enoxaparin, warfarin -cimetidine -clozapine -diuretics -fentanyl -furazolidone -indinavir -isoniazid -lithium -metoprolol -NSAIDS, medicines for pain and inflammation, like ibuprofen or  naproxen -other medicines that prolong the QT interval (cause an abnormal heart  rhythm) -procarbazine -rasagiline -supplements like St. John's wort, kava kava, valerian -tramadol -tryptophan This list may not describe all possible interactions. Give your health care provider a list of all the medicines, herbs, non-prescription drugs, or dietary supplements you use. Also tell them if you smoke, drink alcohol, or use illegal drugs. Some items may interact with your medicine. What should I watch for while using this medicine? Tell your doctor if your symptoms do not get better or if they get worse. Visit your doctor or health care professional for regular checks on your progress. Because it may take several weeks to see the full effects of this medicine, it is important to continue your treatment as prescribed by your doctor. Patients and their families should watch out for new or worsening thoughts of suicide or depression. Also watch out for sudden changes in feelings such as feeling anxious, agitated, panicky, irritable, hostile, aggressive, impulsive, severely restless, overly excited and hyperactive, or not being able to sleep. If this happens, especially at the beginning of treatment or after a change in dose, call your health care professional. This medicine can cause an increase in blood pressure. Check with your doctor for instructions on monitoring your blood pressure while taking this medicine. You may get drowsy or dizzy. Do not drive, use machinery, or do anything that needs mental alertness until you know how this medicine affects you. Do not stand or sit up quickly, especially if you are an older patient. This reduces the risk of dizzy or fainting spells. Alcohol may interfere with the effect of this medicine. Avoid alcoholic drinks. Your mouth may get dry. Chewing sugarless gum, sucking hard candy and drinking plenty of water will help. Contact your doctor if the problem does not go away or is severe. What side effects may I notice from receiving this medicine? Side  effects that you should report to your doctor or health care professional as soon as possible: -allergic reactions like skin rash, itching or hives, swelling of the face, lips, or tongue -breathing problems -changes in vision -seizures -suicidal thoughts or other mood changes -trouble passing urine or change in the amount of urine -unusual bleeding or bruising Side effects that usually do not require medical attention (report to your doctor or health care professional if they continue or are bothersome): -change in sex drive or performance -constipation -increased sweating -loss of appetite -nausea -tremors -weight loss This list may not describe all possible side effects. Call your doctor for medical advice about side effects. You may report side effects to FDA at 1-800-FDA-1088. Where should I keep my medicine? Keep out of the reach of children. Store at a controlled temperature between 20 and 25 degrees C (68 and 77 degrees F), in a dry place. Throw away any unused medicine after the expiration date. NOTE: This sheet is a summary. It may not cover all possible information. If you have questions about this medicine, talk to your doctor, pharmacist, or health care provider.    2016, Elsevier/Gold Standard. (2012-10-02 12:43:55)

## 2015-02-10 NOTE — Progress Notes (Addendum)
 Chief Complaint:  Chief Complaint  Patient presents with  . Annual Exam    HPI: Cynthia Stewart is a 64 y.o. 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 She has some decisions to make about her job Breast exam at [place], she did not get one last year, she will schedule it herself.  Colonscopy? Will have to get recrods from Dr Bobbye CharlestonGibson's office  Pap in 2013- s/p Hystererectomy without cervix for benign fibroids , no records on file.  She wants to change her Diovan HCT since too expensive  Also she wants to get off Vivelle since expensive as well but is worried about hot flashes UTD on T dap, would like flu vaccine She is still taking care of her husband, he has same foot issues.      Last physical: 2015 Pap smear: 2013, negative for HPV and cervical changes ( no recodrs on file)  Mammogram: October 2014 Colonoscopy: 2013, negative for polyps, [facility] Bone [facility]: 2013, [facility] TDAP: Unknown, will receive this today.  Pneumovax: N/A Zostavax: Never received  Influenza: Negative Eye exam: June of 15 Dental exam: June of 15   Past Medical History  Diagnosis Date  . Allergy   . Hypertension   . Hyperlipidemia    Past Surgical History  Procedure Laterality Date  . Abdominal hysterectomy    . Appendectomy    . Cholecystectomy     Social History   Social History  . Marital Status: Married    Spouse Name: N/A  . Number of Children: N/A  . 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 Smoking status: Never Smoker   . Smokeless tobacco: Never Used  . Alcohol Use: No  . Drug Use: None  . Sexual Activity: Yes   Other Topics Concern  . None   Social History Narrative   Married. Education: [employer]. Exercise: No.    Family History  Problem Relation Age of Onset  . Hypertension Mother   . Diabetes Mother   . Hyperlipidemia Mother   . Hyperlipidemia Sister   . Hypertension Sister   . Hypertension Brother    Allergies  Allergen Reactions  . Codeine Other (See Comments)    HALLUCINATION  . Percocet [Oxycodone-Acetaminophen] Itching and Other (See Comments)    HALLUCINATION  . Trileptal [Oxcarbazepine]    Prior to Admission medications   Medication Sig Start Date End Date Taking? Authorizing Provider  azelastine (ASTELIN) 0.1 % nasal spray Place 2 sprays into both nostrils 2 (two) times daily. Use in each nostril as directed 02/07/14  Yes Ofilia NeasMichael L Clark, PA-C  Guaifenesin (MUCINEX MAXIMUM STRENGTH) 1200 MG TB12 Take 1 tablet (1,200 mg total) by mouth every 12 (twelve) hours as needed. 12/17/14  Yes Collie SiadStephanie D English, PA  Vitamin D, Ergocalciferol, (DRISDOL) 50000 UNITS CAPS capsule Take 1 capsule (50,000 Units total) by mouth every 7 (seven) days. 07/18/14  Yes  P , DO  hydrochlorothiazide (HYDRODIURIL) 12.5 MG tablet Take 1 tablet (12.5 mg total) by mouth daily. 02/10/15    P , DO  LORazepam (ATIVAN) 0.5 MG tablet TAKE 1/2 TABLET BY MOUTH AT BEDTIME AS NEEDED FOR INSOMNIA/STRESS 02/10/15    P , DO  rosuvastatin (CRESTOR)  5 MG tablet TAKE 1 TABLET BY MOUTH AT BEDTIME.  "OV NEEDED" Patient not taking: Reported on 02/10/2015 10/29/14   Morrell Riddle, PA-C  valsartan (DIOVAN) 320 MG tablet Take 1 tablet (320 mg total) by mouth daily. 02/10/15    P , DO  venlafaxine (EFFEXOR) 37.5 MG tablet Take 1 tablet (37.5 mg total) by mouth 2 (two) times daily with a meal. 02/10/15    P , DO     ROS: The patient denies fevers, chills, night sweats, unintentional weight loss, chest pain, palpitations, wheezing, dyspnea on exertion, nausea, vomiting, abdominal pain, dysuria, hematuria, melena, numbness, weakness, or tingling.   All other systems have been reviewed and were otherwise  negative with the exception of those mentioned in the HPI and as above.    PHYSICAL EXAM: Filed Vitals:   02/10/15 1441  BP: 132/80  Pulse: 103  Temp: 97.4 F (36.3 C)  Resp: 18   Body mass index is 30.5 kg/(m^2).   General: Alert, no acute distress HEENT:  Normocephalic, atraumatic, oropharynx patent. EOMI, PERRLA Cardiovascular:  Regular rate and rhythm, no rubs murmurs or gallops.  No Carotid bruits, radial pulse intact. No pedal edema.  Respiratory: Clear to auscultation bilaterally.  No wheezes, rales, or rhonchi.  No cyanosis, no use of accessory musculature Abdominal: No organomegaly, abdomen is soft and non-tender, positive bowel sounds. No masses. Skin: No rashes. Neurologic: Facial musculature symmetric. Psychiatric: Patient acts appropriately throughout our interaction. Lymphatic: No cervical or submandibular lymphadenopathy Musculoskeletal: Gait intact. No edema, tenderness Breast  Exam normal, no LAD GU-no cervix, no lesions, no adenexal tenderness, no rashes, no vaginal dc  LABS: Results for orders placed or performed in visit on 09/11/14  [facility] MAMMOGRAPHY  Result Value Ref Range   [facility] Mammogram care everywhere      EKG/XRAY:   Primary read interpreted by Dr. Conley Rolls at [facility].   ASSESSMENT/PLAN: Encounter Diagnoses  Name Primary?  . Encounter for annual physical exam Yes  . Screening for breast cancer   . Essential hypertension   . Influenza vaccine needed   . History of hyperglycemia   . Elevated LDL cholesterol level   . Pap smear for cervical cancer screening   . Hot flashes   . Vitamin D deficiency    She will go off vivelle dot since too expensive for her We will transition her to Effexor with precautions for her work stress and also for hot flash sxs Will go ahead and get pap done since no records on file after all these years, she has no cervix so if this normal then will not need to do another one unless has sxs She will call the [facility] breast  center to get her mammogram scheduled Flu vaccine given today I separated her Diovan and HCTZ so it may be less expensive Labs pending, pap pending   Gross sideeffects, risk and benefits, and alternatives of medications d/w patient. Patient is aware that all medications have potential sideeffects and we are unable to predict every sideeffect or drug-drug interaction that may occur.    DO  02/10/2015 7:27 PM

## 2015-02-11 LAB — COMPLETE METABOLIC PANEL WITH GFR
AST: 23 U/L (ref 10–35)
BUN: 14 mg/dL (ref 7–25)
CO2: 27 mmol/L (ref 20–31)
Chloride: 99 mmol/L (ref 98–110)
Glucose, Bld: 144 mg/dL — ABNORMAL HIGH (ref 65–99)
Potassium: 4.4 mmol/L (ref 3.5–5.3)
Sodium: 138 mmol/L (ref 135–146)

## 2015-02-11 LAB — COMPLETE METABOLIC PANEL WITHOUT GFR
ALT: 21 U/L (ref 6–29)
Albumin: 4 g/dL (ref 3.6–5.1)
Alkaline Phosphatase: 62 U/L (ref 33–130)
Calcium: 10 mg/dL (ref 8.6–10.4)
Creat: 0.99 mg/dL (ref 0.50–0.99)
GFR, Est African American: 71 mL/min (ref >=60)
GFR, Est Non African American: 61 mL/min (ref >=60)
Total Bilirubin: 0.9 mg/dL (ref 0.2–1.2)
Total Protein: 7.2 g/dL (ref 6.1–8.1)

## 2015-02-11 LAB — LIPID PANEL
Cholesterol: 289 mg/dL — ABNORMAL HIGH (ref 125–200)
HDL: 81 mg/dL (ref >=46)
LDL Cholesterol: 189 mg/dL — ABNORMAL HIGH (ref <130)
Total CHOL/HDL Ratio: 3.6 Ratio (ref <=5.0)
Triglycerides: 96 mg/dL (ref <150)
VLDL: 19 mg/dL (ref <30)

## 2015-02-11 LAB — TSH: TSH: 1.116 u[IU]/mL (ref 0.350–4.500)

## 2015-02-11 LAB — VITAMIN D 25 HYDROXY (VIT D DEFICIENCY, FRACTURES): Vit D, 25-Hydroxy: 21 ng/mL — ABNORMAL LOW (ref 30–100)

## 2015-02-13 LAB — PAP IG W/ RFLX HPV ASCU

## 2015-02-18 ENCOUNTER — Encounter: Payer: Self-pay | Admitting: Family Medicine

## 2015-02-28 LAB — HM MAMMOGRAPHY

## 2015-03-02 ENCOUNTER — Other Ambulatory Visit: Payer: Self-pay | Admitting: Family Medicine

## 2015-03-03 ENCOUNTER — Telehealth: Payer: Self-pay

## 2015-03-03 NOTE — Telephone Encounter (Signed)
Dr. Conley RollsLe.   Patient cannot take the venlafaxine (EFFEXOR) 37.5 MG tablet It is upsetting her stomach.   She would like something else called into [facility] for her hot flashes.   [phone]

## 2015-03-03 NOTE — Telephone Encounter (Signed)
p 

## 2015-03-04 NOTE — Telephone Encounter (Signed)
Spoke to patient about labs , will try to [employer] and exercise return for fasting lipids in April and A1c , she d msk aches with statin, A1c was less than 7 in the past. She doe snot want to be on effexor, she would like to go on cheaper version oVivell eDot, i will coordinate that.

## 2015-03-09 ENCOUNTER — Encounter: Payer: Self-pay | Admitting: Family Medicine

## 2015-03-10 ENCOUNTER — Other Ambulatory Visit: Payer: Self-pay | Admitting: Family Medicine

## 2015-03-10 ENCOUNTER — Telehealth: Payer: Self-pay

## 2015-03-10 NOTE — Telephone Encounter (Signed)
Called pharmacy and vivell edot and or equivalent about same cost for her if same insurance $11 for 8 patches, advise her we can try oral pills or she can stick with same or similar ie climara

## 2015-03-12 ENCOUNTER — Telehealth: Payer: Self-pay

## 2015-03-12 NOTE — Telephone Encounter (Signed)
Pt was checking on the status of medication that Dr. Conley RollsLe was researching on as far as cost. She would like for the original meds to be sent in because she cant wait any longer. But would like for Le to continue looking for inexpensive ones. [facility] [address]  Please advise   [phone]

## 2015-03-13 ENCOUNTER — Other Ambulatory Visit: Payer: Self-pay | Admitting: Family Medicine

## 2015-03-13 DIAGNOSIS — R232 Flushing: Secondary | ICD-10-CM

## 2015-03-13 MED ORDER — ESTRADIOL 0.05 MG/24HR TD PTTW: 1.0000 | MEDICATED_PATCH | TRANSDERMAL | Status: DC

## 2015-03-13 NOTE — Telephone Encounter (Signed)
Called [facility] and they said they could not run price for vivelle dot bc filled somewhere else,patient state she ha snot filled it since October. I [facility] and also [facility] in [place] and they do not have any recent refills. So they sggested to call her insurance. For now I will give her 1 month to go to [facility].

## 2015-03-24 ENCOUNTER — Ambulatory Visit (INDEPENDENT_AMBULATORY_CARE_PROVIDER_SITE_OTHER): Payer: BLUE CROSS/BLUE SHIELD | Admitting: Urgent Care

## 2015-03-24 VITALS — BP 130/70 | HR 75 | Temp 98.3°F | Resp 16 | Ht 64.75 in | Wt 179.4 lb

## 2015-03-24 DIAGNOSIS — J302 Other seasonal allergic rhinitis: Secondary | ICD-10-CM

## 2015-03-24 DIAGNOSIS — J329 Chronic sinusitis, unspecified: Secondary | ICD-10-CM

## 2015-03-24 MED ORDER — FLUCONAZOLE 150 MG PO TABS: 150.0000 mg | ORAL_TABLET | Freq: Once | ORAL | Status: DC

## 2015-03-24 MED ORDER — AMOXICILLIN-POT CLAVULANATE 875-125 MG PO TABS: 1.0000 | ORAL_TABLET | Freq: Two times a day (BID) | ORAL | Status: DC

## 2015-03-24 MED ORDER — MONTELUKAST SODIUM 10 MG PO TABS: 10.0000 mg | ORAL_TABLET | Freq: Every day | ORAL | Status: DC

## 2015-03-24 NOTE — Progress Notes (Signed)
    MRN: [id number] DOB: Apr 03, 1950  Subjective:   Cynthia Stewart is a 65 y.o. female presenting for chief complaint of Sinus Problem  Reports ~2 month history of nasal congestion, sinus pain, ear pressure, ear pain, upper teeth area aching, sore throat, now having mostly dry cough, chills. She has tried Mucinex, Coriciden-D, nasal antihistamine for allergies. Also takes Zyrtec for allergies, has failed Nasonex, Flonase nasal steroid despite appropriate administration. Has had allergy testing done and has multiple significant environmental allergies. She denies chest pain, shob, wheezing, n/v, abdominal pain. Denies smoking cigarettes.  Cynthia Stewart has a current medication list which includes the following prescription(s): azelastine, estradiol, guaifenesin, hydrochlorothiazide, lorazepam, vitamin d (ergocalciferol), rosuvastatin, and venlafaxine. Also is allergic to codeine; percocet; and trileptal.  Cynthia Stewart  has a past medical history of Allergy; Hypertension; and Hyperlipidemia. Also  has past surgical history that includes Abdominal hysterectomy; Appendectomy; and Cholecystectomy.  Objective:   Vitals: BP 130/70 mmHg  Pulse 75  Temp(Src) 98.3 F (36.8 C) (Oral)  Resp 16  Ht 5' 4.75" (1.645 m)  Wt 179 lb 6.4 oz (81.375 kg)  BMI 30.07 kg/m2  SpO2 95%  Physical Exam  Constitutional: She is oriented to person, place, and time. She appears well-developed and well-nourished.  HENT:  TM's flat bilaterally, no effusions or erythema. Nasal turbinates boggy, erythematous with thick yellow mucus. Bilateral maxillary sinus tenderness. Significant postnasal drip present, without oropharyngeal exudates, erythema or abscesses.  Eyes: Right eye exhibits no discharge. Left eye exhibits no discharge. No scleral icterus.  Neck: Normal range of motion. Neck supple.  Cardiovascular: Normal rate, regular rhythm and intact distal pulses.   Pulmonary/Chest: No respiratory distress. She has no  wheezes. She has no rales.  Lymphadenopathy:    She has no cervical adenopathy.  Neurological: She is alert and oriented to person, place, and time.  Skin: Skin is warm and dry. No rash noted. No erythema. No pallor.    Assessment and Plan :   1. Sinusitis, unspecified chronicity, unspecified location - Cover for infectious process secondary to uncontrolled and chronically inflamed sinuses due to allergies - F/u in 2 weeks if no improvement.  2. Seasonal allergies - Start Singulair, consider referral to allergist for allergy shots.  Wallis BambergMario Kinslie Hove, PA-C [facility] [phone] 03/24/2015 7:16 PM

## 2015-03-24 NOTE — Patient Instructions (Signed)
Sinusitis, Adult  Sinusitis is redness, soreness, and inflammation of the paranasal sinuses. Paranasal sinuses are air pockets within the bones of your face. They are located beneath your eyes, in the middle of your forehead, and above your eyes. In healthy paranasal sinuses, mucus is able to drain out, and air is able to circulate through them by way of your nose. However, when your paranasal sinuses are inflamed, mucus and air can become trapped. This can allow bacteria and other germs to grow and cause infection.  Sinusitis can develop quickly and last only a short time (acute) or continue over a long period (chronic). Sinusitis that lasts for more than 12 weeks is considered chronic.  CAUSES  Causes of sinusitis include:  · Allergies.  · Structural abnormalities, such as displacement of the cartilage that separates your nostrils (deviated septum), which can decrease the air flow through your nose and sinuses and affect sinus drainage.  · Functional abnormalities, such as when the small hairs (cilia) that line your sinuses and help remove mucus do not work properly or are not present.  SIGNS AND SYMPTOMS  Symptoms of acute and chronic sinusitis are the same. The primary symptoms are pain and pressure around the affected sinuses. Other symptoms include:  · Upper toothache.  · Earache.  · Headache.  · Bad breath.  · Decreased sense of smell and taste.  · A cough, which worsens when you are lying flat.  · Fatigue.  · Fever.  · Thick drainage from your nose, which often is green and may contain pus (purulent).  · Swelling and warmth over the affected sinuses.  DIAGNOSIS  Your health care provider will perform a physical exam. During your exam, your health care provider may perform any of the following to help determine if you have acute sinusitis or chronic sinusitis:  · Look in your nose for signs of abnormal growths in your nostrils (nasal polyps).  · Tap over the affected sinus to check for signs of  infection.  · View the inside of your sinuses using an imaging device that has a light attached (endoscope).  If your health care provider suspects that you have chronic sinusitis, one or more of the following tests may be recommended:  · Allergy tests.  · Nasal culture. A sample of mucus is taken from your nose, sent to a lab, and screened for bacteria.  · Nasal cytology. A sample of mucus is taken from your nose and examined by your health care provider to determine if your sinusitis is related to an allergy.  TREATMENT  Most cases of acute sinusitis are related to a viral infection and will resolve on their own within 10 days. Sometimes, medicines are prescribed to help relieve symptoms of both acute and chronic sinusitis. These may include pain medicines, decongestants, nasal steroid sprays, or saline sprays.  However, for sinusitis related to a bacterial infection, your health care provider will prescribe antibiotic medicines. These are medicines that will help kill the bacteria causing the infection.  Rarely, sinusitis is caused by a fungal infection. In these cases, your health care provider will prescribe antifungal medicine.  For some cases of chronic sinusitis, surgery is needed. Generally, these are cases in which sinusitis recurs more than 3 times per year, despite other treatments.  HOME CARE INSTRUCTIONS  · Drink plenty of water. Water helps thin the mucus so your sinuses can drain more easily.  · Use a humidifier.  · Inhale steam 3-4 times a day (for   example, sit in the bathroom with the shower running).  · Apply a warm, moist washcloth to your face 3-4 times a day, or as directed by your health care provider.  · Use saline nasal sprays to help moisten and clean your sinuses.  · Take medicines only as directed by your health care provider.  · If you were prescribed either an antibiotic or antifungal medicine, finish it all even if you start to feel better.  SEEK IMMEDIATE MEDICAL CARE IF:  · You have  increasing pain or severe headaches.  · You have nausea, vomiting, or drowsiness.  · You have swelling around your face.  · You have vision problems.  · You have a stiff neck.  · You have difficulty breathing.     This information is not intended to replace advice given to you by your health care provider. Make sure you discuss any questions you have with your health care provider.     Document Released: 03/07/2005 Document Revised: 03/28/2014 Document Reviewed: 03/22/2011  Elsevier Interactive Patient Education ©2016 Elsevier Inc.    Allergies  An allergy is an abnormal reaction to a substance by the body's defense system (immune system). Allergies can develop at any age.  WHAT CAUSES ALLERGIES?  An allergic reaction happens when the immune system mistakenly reacts to a normally harmless substance, called an allergen, as if it were harmful. The immune system releases antibodies to fight the substance. Antibodies eventually release a chemical called histamine into the bloodstream. The release of histamine is meant to protect the body from infection, but it also causes discomfort.  An allergic reaction can be triggered by:  · Eating an allergen.  · Inhaling an allergen.  · Touching an allergen.  WHAT TYPES OF ALLERGIES ARE THERE?  There are many types of allergies. Common types include:  · Seasonal allergies. People with this type of allergy are usually allergic to substances that are only present during certain seasons, such as molds and pollens.  · Food allergies.  · Drug allergies.  · Insect allergies.  · Animal dander allergies.  WHAT ARE SYMPTOMS OF ALLERGIES?  Possible allergy symptoms include:  · Swelling of the lips, face, tongue, mouth, or throat.  · Sneezing, coughing, or wheezing.  · Nasal congestion.  · Tingling in the mouth.  · Rash.  · Itching.  · Itchy, red, swollen areas of skin (hives).  · Watery eyes.  · Vomiting.  · Diarrhea.  · Dizziness.  · Lightheadedness.  · Fainting.  · Trouble breathing or  swallowing.  · Chest tightness.  · Rapid heartbeat.  HOW ARE ALLERGIES DIAGNOSED?  Allergies are diagnosed with a medical and family history and one or more of the following:  · Skin tests.  · Blood tests.  · A food diary. A food diary is a record of all the foods and drinks you have in a day and of all the symptoms you experience.  · The results of an elimination diet. An elimination diet involves eliminating foods from your diet and then adding them back in one by one to find out if a certain food causes an allergic reaction.  HOW ARE ALLERGIES TREATED?  There is no cure for allergies, but allergic reactions can be treated with medicine. Severe reactions usually need to be treated at a hospital.  HOW CAN REACTIONS BE PREVENTED?  The best way to prevent an allergic reaction is by avoiding the substance you are allergic to. Allergy shots and medicines can   also help prevent reactions in some cases. People with severe allergic reactions may be able to prevent a life-threatening reaction called anaphylaxis with a medicine given right after exposure to the allergen.     This information is not intended to replace advice given to you by your health care provider. Make sure you discuss any questions you have with your health care provider.     Document Released: 05/31/2002 Document Revised: 03/28/2014 Document Reviewed: 12/17/2013  Elsevier Interactive Patient Education ©2016 Elsevier Inc.

## 2015-05-18 ENCOUNTER — Ambulatory Visit (INDEPENDENT_AMBULATORY_CARE_PROVIDER_SITE_OTHER): Payer: BLUE CROSS/BLUE SHIELD | Admitting: Family Medicine

## 2015-05-18 VITALS — BP 158/90 | HR 74 | Temp 98.0°F | Resp 16 | Ht 64.0 in | Wt 176.4 lb

## 2015-05-18 DIAGNOSIS — E7800 Pure hypercholesterolemia, unspecified: Secondary | ICD-10-CM | POA: Diagnosis not present

## 2015-05-18 DIAGNOSIS — I10 Essential (primary) hypertension: Secondary | ICD-10-CM | POA: Diagnosis not present

## 2015-05-18 DIAGNOSIS — F411 Generalized anxiety disorder: Secondary | ICD-10-CM

## 2015-05-18 DIAGNOSIS — E119 Type 2 diabetes mellitus without complications: Secondary | ICD-10-CM | POA: Diagnosis not present

## 2015-05-18 LAB — COMPLETE METABOLIC PANEL WITH GFR
ALT: 21 U/L (ref 6–29)
AST: 23 U/L (ref 10–35)
Albumin: 4.1 g/dL (ref 3.6–5.1)
Alkaline Phosphatase: 63 U/L (ref 33–130)
BUN: 13 mg/dL (ref 7–25)
CO2: 28 mmol/L (ref 20–31)
GFR, Est Non African American: 68 mL/min (ref >=60)
Glucose, Bld: 134 mg/dL — ABNORMAL HIGH (ref 65–99)
Sodium: 138 mmol/L (ref 135–146)
Total Bilirubin: 0.7 mg/dL (ref 0.2–1.2)
Total Protein: 7.7 g/dL (ref 6.1–8.1)

## 2015-05-18 LAB — COMPLETE METABOLIC PANEL WITHOUT GFR
Calcium: 9.7 mg/dL (ref 8.6–10.4)
Chloride: 100 mmol/L (ref 98–110)
Creat: 0.91 mg/dL (ref 0.50–0.99)
GFR, Est African American: 78 mL/min (ref >=60)
Potassium: 4 mmol/L (ref 3.5–5.3)

## 2015-05-18 LAB — POCT GLYCOSYLATED HEMOGLOBIN (HGB A1C): Hemoglobin A1C: 7

## 2015-05-18 MED ORDER — CLONAZEPAM 0.5 MG PO TABS: 0.5000 mg | ORAL_TABLET | Freq: Two times a day (BID) | ORAL | Status: DC

## 2015-05-18 MED ORDER — FLUOXETINE HCL 20 MG PO TABS: 20.0000 mg | ORAL_TABLET | Freq: Every day | ORAL | Status: DC

## 2015-05-18 NOTE — Patient Instructions (Signed)
Fluoxetine capsules or tablets (Depression/Mood Disorders) What is this medicine? FLUOXETINE (floo OX e teen) belongs to a class of drugs known as selective serotonin reuptake inhibitors (SSRIs). It helps to treat mood problems such as depression, obsessive compulsive disorder, and panic attacks. It can also treat certain eating disorders. This medicine may be used for other purposes; ask your health care provider or pharmacist if you have questions. What should I tell my health care provider before I take this medicine? They need to know if you have any of these conditions: -bipolar disorder or mania -diabetes -glaucoma -liver disease -psychosis -seizures -suicidal thoughts or history of attempted suicide -an unusual or allergic reaction to fluoxetine, other medicines, foods, dyes, or preservatives -pregnant or trying to get pregnant -breast-feeding How should I use this medicine? Take this medicine by mouth with a glass of water. Follow the directions on the prescription label. You can take this medicine with or without food. Take your medicine at regular intervals. Do not take it more often than directed. Do not stop taking this medicine suddenly except upon the advice of your doctor. Stopping this medicine too quickly may cause serious side effects or your condition may worsen. A special MedGuide will be given to you by the pharmacist with each prescription and refill. Be sure to read this information carefully each time. Talk to your pediatrician regarding the use of this medicine in children. While this drug may be prescribed for children as young as 7 years for selected conditions, precautions do apply. Overdosage: If you think you have taken too much of this medicine contact a poison control center or emergency room at once. NOTE: This medicine is only for you. Do not share this medicine with others. What if I miss a dose? If you miss a dose, skip the missed dose and go back to your  regular dosing schedule. Do not take double or extra doses. What may interact with this medicine? Do not take fluoxetine with any of the following medications: -other medicines containing fluoxetine, like Sarafem or Symbyax -cisapride -linezolid -MAOIs like Carbex, Eldepryl, Marplan, Nardil, and Parnate -methylene blue (injected into a vein) -pimozide -thioridazine This medicine may also interact with the following medications: -alcohol -aspirin and aspirin-like medicines -carbamazepine -certain medicines for depression, anxiety, or psychotic disturbances -certain medicines for migraine headaches like almotriptan, eletriptan, frovatriptan, naratriptan, rizatriptan, sumatriptan, zolmitriptan -digoxin -diuretics -fentanyl -flecainide -furazolidone -isoniazid -lithium -medicines for sleep -medicines that treat or prevent blood clots like warfarin, enoxaparin, and dalteparin -NSAIDs, medicines for pain and inflammation, like ibuprofen or naproxen -phenytoin -procarbazine -propafenone -rasagiline -ritonavir -supplements like St. John's wort, kava kava, valerian -tramadol -tryptophan -vinblastine This list may not describe all possible interactions. Give your health care provider a list of all the medicines, herbs, non-prescription drugs, or dietary supplements you use. Also tell them if you smoke, drink alcohol, or use illegal drugs. Some items may interact with your medicine. What should I watch for while using this medicine? Tell your doctor if your symptoms do not get better or if they get worse. Visit your doctor or health care professional for regular checks on your progress. Because it may take several weeks to see the full effects of this medicine, it is important to continue your treatment as prescribed by your doctor. Patients and their families should watch out for new or worsening thoughts of suicide or depression. Also watch out for sudden changes in feelings such as  feeling anxious, agitated, panicky, irritable, hostile, aggressive, impulsive, severely restless,   overly excited and hyperactive, or not being able to sleep. If this happens, especially at the beginning of treatment or after a change in dose, call your health care professional. Cynthia Stewart may get drowsy or dizzy. Do not drive, use machinery, or do anything that needs mental alertness until you know how this medicine affects you. Do not stand or sit up quickly, especially if you are an older patient. This reduces the risk of dizzy or fainting spells. Alcohol may interfere with the effect of this medicine. Avoid alcoholic drinks. Your mouth may get dry. Chewing sugarless gum or sucking hard candy, and drinking plenty of water may help. Contact your doctor if the problem does not go away or is severe. This medicine may affect blood sugar levels. If you have diabetes, check with your doctor or health care professional before you change your diet or the dose of your diabetic medicine. What side effects may I notice from receiving this medicine? Side effects that you should report to your doctor or health care professional as soon as possible: -allergic reactions like skin rash, itching or hives, swelling of the face, lips, or tongue -breathing problems -confusion -eye pain, changes in vision -fast or irregular heart rate, palpitations -flu-like fever, chills, cough, muscle or joint aches and pains -seizures -suicidal thoughts or other mood changes -swelling or redness in or around the eye -tremors -trouble sleeping -unusual bleeding or bruising -unusually tired or weak -vomiting Side effects that usually do not require medical attention (report to your doctor or health care professional if they continue or are bothersome): -change in sex drive or performance -diarrhea -dry mouth -flushing -headache -increased or decreased appetite -nausea -sweating This list may not describe all possible side  effects. Call your doctor for medical advice about side effects. You may report side effects to FDA at 1-800-FDA-1088. Where should I keep my medicine? Keep out of the reach of children. Store at room temperature between 15 and 30 degrees C (59 and 86 degrees F). Throw away any unused medicine after the expiration date. NOTE: This sheet is a summary. It may not cover all possible information. If you have questions about this medicine, talk to your doctor, pharmacist, or health care provider.    2016, Elsevier/Gold Standard. (2014-02-28 12:40:07) Hypertension Hypertension, commonly called high blood pressure, is when the force of blood pumping through your arteries is too strong. Your arteries are the blood vessels that carry blood from your heart throughout your body. A blood pressure reading consists of a higher number over a lower number, such as 110/72. The higher number (systolic) is the pressure inside your arteries when your heart pumps. The lower number (diastolic) is the pressure inside your arteries when your heart relaxes. Ideally you want your blood pressure below 120/80. Hypertension forces your heart to work harder to pump blood. Your arteries may become narrow or stiff. Having untreated or uncontrolled hypertension can cause heart attack, stroke, kidney disease, and other problems. RISK FACTORS Some risk factors for high blood pressure are controllable. Others are not.  Risk factors you cannot control include:   Race. You may be at higher risk if you are African American.  Age. Risk increases with age.  Gender. Men are at higher risk than women before age 47 years. After age 50, women are at higher risk than men. Risk factors you can control include:  Not getting enough exercise or physical activity.  Being overweight.  Getting too much fat, sugar, calories, or salt in your  diet.  Drinking too much alcohol. SIGNS AND SYMPTOMS Hypertension does not usually cause signs or  symptoms. Extremely high blood pressure (hypertensive crisis) may cause headache, anxiety, shortness of breath, and nosebleed. DIAGNOSIS To check if you have hypertension, your health care provider will measure your blood pressure while you are seated, with your arm held at the level of your heart. It should be measured at least twice using the same arm. Certain conditions can cause a difference in blood pressure between your right and left arms. A blood pressure reading that is higher than normal on one occasion does not mean that you need treatment. If it is not clear whether you have high blood pressure, you may be asked to return on a different day to have your blood pressure checked again. Or, you may be asked to monitor your blood pressure at home for 1 or more weeks. TREATMENT Treating high blood pressure includes making lifestyle changes and possibly taking medicine. Living a healthy lifestyle can help lower high blood pressure. You may need to change some of your habits. Lifestyle changes may include:  Following the DASH diet. This diet is high in fruits, vegetables, and whole grains. It is low in salt, red meat, and added sugars.  Keep your sodium intake below 2,300 mg per day.  Getting at least 30-45 minutes of aerobic exercise at least 4 times per week.  Losing weight if necessary.  Not smoking.  Limiting alcoholic beverages.  Learning ways to reduce stress. Your health care provider may prescribe medicine if lifestyle changes are not enough to get your blood pressure under control, and if one of the following is true:  You are 41-56 years of age and your systolic blood pressure is above 140.  You are 90 years of age or older, and your systolic blood pressure is above 150.  Your diastolic blood pressure is above 90.  You have diabetes, and your systolic blood pressure is over 140 or your diastolic blood pressure is over 90.  You have kidney disease and your blood pressure is  above 140/90.  You have heart disease and your blood pressure is above 140/90. Your personal target blood pressure may vary depending on your medical conditions, your age, and other factors. HOME CARE INSTRUCTIONS  Have your blood pressure rechecked as directed by your health care provider.   Take medicines only as directed by your health care provider. Follow the directions carefully. Blood pressure medicines must be taken as prescribed. The medicine does not work as well when you skip doses. Skipping doses also puts you at risk for problems.  Do not smoke.   Monitor your blood pressure at home as directed by your health care provider. SEEK MEDICAL CARE IF:   You think you are having a reaction to medicines taken.  You have recurrent headaches or feel dizzy.  You have swelling in your ankles.  You have trouble with your vision. SEEK IMMEDIATE MEDICAL CARE IF:  You develop a severe headache or confusion.  You have unusual weakness, numbness, or feel faint.  You have severe chest or abdominal pain.  You vomit repeatedly.  You have trouble breathing. MAKE SURE YOU:   Understand these instructions.  Will watch your condition.  Will get help right away if you are not doing well or get worse.   This information is not intended to replace advice given to you by your health care provider. Make sure you discuss any questions you have with your health  care provider.   Document Released: 03/07/2005 Document Revised: 07/22/2014 Document Reviewed: 12/28/2012 Elsevier Interactive Patient Education [employer].

## 2015-05-18 NOTE — Progress Notes (Signed)
Chief Complaint:  Chief Complaint  Patient presents with  . Hypertension    pt feels she is under stress and fatigue.  . depression screen    HPI: Cynthia Stewart is a 65 y.o. female who reports to [facility] today complaining of: 1. Stress at work -she has been struggling  withthis for 3 years but has gotten worse in several month, BP has been high due to work stress. SHe has ahd a lot of coworkers quit and burden of work falls on her, she feels she is being penalized and talked to in a demeaning way by her manager, she ahs complained to her supervisors and also to HR but nothing seems to change. She is 85 and cannot get another job easily, transferred here from [place]. She is not doing well emotionally or physically . She is the primary breadwinner as well. She does not want to relocate to [facility] for 3 years until retirment. She ahs been with the company for 9 years and moved down here to be coloser to family but this jb has been causing a lot of problems . Dneis SI HI or hallucinations 2. HTN not well controlled, BP has never been this high  3. Diabetes not well controlled, she is diet controlled and usuallly in 4-5 but has been hgh recently due to stress at work. Cannot sleep or rest or eat well.   Wt Readings from Last 3 Encounters:  05/18/15 176 lb 6.4 oz (80.015 kg)  03/24/15 179 lb 6.4 oz (81.375 kg)  02/10/15 177 lb 12.8 oz (80.65 kg)     BP Readings from Last 3 Encounters:  05/18/15 158/90  03/24/15 130/70  02/10/15 132/80   Lab Results  Component Value Date   HGBA1C 7.1 02/10/2015   HGBA1C 7.1* 02/10/2015   HGBA1C 6.6 07/18/2014   Lab Results  Component Value Date   MICROALBUR <0.2 07/18/2014   LDLCALC 189* 02/10/2015   CREATININE 0.99 02/10/2015     Past Medical History  Diagnosis Date  . Allergy   . Hypertension   . Hyperlipidemia    Past Surgical History  Procedure Laterality Date  . Abdominal hysterectomy    . Appendectomy    . Cholecystectomy      Social History   Social History  . Marital Status: Married    Spouse Name: N/A  . Number of Children: N/A  . 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 Smoking status: Never Smoker   . Smokeless tobacco: Never Used  . Alcohol Use: No  . Drug Use: None  . Sexual Activity: Yes   Other Topics Concern  . None   Social History Narrative   Married. Education: [employer]. Exercise: No.   Family History  Problem Relation Age of Onset  . Hypertension Mother   . Diabetes Mother   . Hyperlipidemia Mother   . Hyperlipidemia Sister   . Hypertension Sister   . Hypertension Brother    Allergies  Allergen Reactions  . Codeine Other (See Comments)    HALLUCINATION  . Percocet [Oxycodone-Acetaminophen] Itching and Other (See Comments)    HALLUCINATION  . Trileptal [Oxcarbazepine]    Prior to Admission medications   Medication Sig Start Date End Date Taking? Authorizing Provider  azelastine (ASTELIN) 0.1 % nasal spray Place 2 sprays into both nostrils 2 (two) times daily. Use in each nostril as directed 02/07/14  Yes Casimiro Needle  L Clark, PA-C  estradiol (VIVELLE-DOT) 0.05 MG/24HR patch Place 1 patch (0.05 mg total) onto the skin 2 (two) times a week. 03/13/15  Yes Taran Hable P Delbert Darley, DO  hydrochlorothiazide (HYDRODIURIL) 12.5 MG tablet Take 1 tablet (12.5 mg total) by mouth daily. 02/10/15  Yes Trenell Moxey P Tanysha Quant, DO  LORazepam (ATIVAN) 0.5 MG tablet TAKE 1/2 TABLET BY MOUTH AT BEDTIME AS NEEDED FOR INSOMNIA/STRESS 02/10/15  Yes Carthel Castille P Alwyn Cordner, DO  amoxicillin-clavulanate (AUGMENTIN) 875-125 MG tablet Take 1 tablet by mouth 2 (two) times daily. Patient not taking: Reported on 05/18/2015 03/24/15   Wallis Bamberg, PA-C  fluconazole (DIFLUCAN) 150 MG tablet Take 1 tablet (150 mg total) by mouth once. Repeat in 72 hours if needed Patient not taking: Reported on 05/18/2015 03/24/15   Wallis Bamberg, PA-C  montelukast (SINGULAIR) 10 MG tablet Take 1 tablet (10 mg total)  by mouth at bedtime. Patient not taking: Reported on 05/18/2015 03/24/15   Wallis Bamberg, PA-C  rosuvastatin (CRESTOR) 5 MG tablet TAKE 1 TABLET BY MOUTH AT BEDTIME.  "OV NEEDED" Patient not taking: Reported on 02/10/2015 10/29/14   Morrell Riddle, PA-C  venlafaxine [facility]) 37.5 MG tablet Take 1 tablet (37.5 mg total) by mouth 2 (two) times daily with a meal. Patient not taking: Reported on 03/24/2015 02/10/15   Zacharie Portner P Tai Skelly, DO  Vitamin D, Ergocalciferol, (DRISDOL) 50000 UNITS CAPS capsule Take 1 capsule (50,000 Units total) by mouth every 7 (seven) days. Patient not taking: Reported on 05/18/2015 07/18/14   Summers Buendia P Rayvin Abid, DO     ROS: The patient denies fevers, chills, night sweats, unintentional weight loss, chest pain, palpitations, wheezing, dyspnea on exertion, nausea, vomiting, abdominal pain, dysuria, hematuria, melena, numbness, weakness, or tingling.   All other systems have been reviewed and were otherwise negative with the exception of those mentioned in the HPI and as above.    PHYSICAL EXAM: Filed Vitals:   05/18/15 0907  BP: 158/90  Pulse: 74  Temp: 98 F (36.7 C)  Resp: 16   Body mass index is 30.26 kg/(m^2).   General: Alert, no acute distress HEENT:  Normocephalic, atraumatic, oropharynx patent. EOMI, PERRLA Cardiovascular:  Regular rate and rhythm, no rubs murmurs or gallops.  No Carotid bruits, radial pulse intact. No pedal edema.  Respiratory: Clear to auscultation bilaterally.  No wheezes, rales, or rhonchi.  No cyanosis, no use of accessory musculature Abdominal: No organomegaly, abdomen is soft and non-tender, positive bowel sounds. No masses. Skin: No rashes. Neurologic: Facial musculature symmetric. Psychiatric: Patient acts appropriately throughout our interaction. Lymphatic: No cervical or submandibular lymphadenopathy Musculoskeletal: Gait intact. No edema, tenderness   LABS: Results for orders placed or performed in visit on 03/09/15  HM MAMMOGRAPHY  Result Value  Ref Range   HM Mammogram      NO SIGNIFICANT ABNOMALITY OR CHANGE  SCREENING MAMMOGRAM IN ONE YEAR       EKG/XRAY:   Primary read interpreted by Dr. Conley Rolls at [facility].   ASSESSMENT/PLAN: Encounter Diagnoses  Name Primary?  Marland Kitchen Anxiety state   . Essential hypertension   . Accelerated hypertension Yes  . Elevated LDL cholesterol level   . Type 2 diabetes mellitus without complication, without long-term current use of insulin (HCC)    Prozac 10 mg x 3 days, then increase to 20 mg daily if tolerate HCTZ 12.5 mg BID , will let me know how her BP is doing She will fu in 4 weeks for re-evaluation Labs pending, FMLA paperwork will be filled out so  she can have 4 weeks to get her BP under control and for Prozac to work  Change ativan to  Clonazepam while prozac starts to work   Gross sideeffects, risk and benefits, and alternatives of medications d/w patient. Patient is aware that all medications have potential sideeffects and we are unable to predict every sideeffect or drug-drug interaction that may occur.  Alvita Fana DO  05/18/2015 10:02 AM

## 2015-05-22 ENCOUNTER — Telehealth: Payer: Self-pay

## 2015-05-22 NOTE — Telephone Encounter (Signed)
Pt needs FMLA forms to be completed by Dr Conley RollsLe, I have filled out what I can from her OV notes and highlighted the areas that need to be updated. Please fill out and return to the FMLA/Disability box at the 102 checkout desk within 5-7 business days. Thank you I will place in your box on 05/22/15.

## 2015-05-28 NOTE — Telephone Encounter (Signed)
Paperwork scanned and faxed to company on 05/28/15 °

## 2015-05-29 ENCOUNTER — Telehealth: Payer: Self-pay

## 2015-05-29 DIAGNOSIS — Z0271 Encounter for disability determination: Secondary | ICD-10-CM

## 2015-05-29 NOTE — Telephone Encounter (Signed)
ERROR

## 2015-06-02 ENCOUNTER — Telehealth: Payer: Self-pay

## 2015-06-02 NOTE — Telephone Encounter (Signed)
Rep from [employer] called stating she received FMLA paperwork but not short term disability///  Forms don't list employer or contact info for rep to reach out to pt.  Cynthia Stewart [phone] [id number] Claim#: [id number]///Called pt's mobile-- spoke with pt and shared info to contact [employer]

## 2015-06-03 ENCOUNTER — Other Ambulatory Visit: Payer: Self-pay | Admitting: Family Medicine

## 2015-06-03 ENCOUNTER — Encounter: Payer: Self-pay | Admitting: Family Medicine

## 2015-06-03 MED ORDER — VALSARTAN 320 MG PO TABS: 320.0000 mg | ORAL_TABLET | Freq: Every day | ORAL | Status: DC

## 2015-06-10 ENCOUNTER — Ambulatory Visit (INDEPENDENT_AMBULATORY_CARE_PROVIDER_SITE_OTHER): Payer: BLUE CROSS/BLUE SHIELD | Admitting: Family Medicine

## 2015-06-10 VITALS — BP 152/80 | HR 69 | Temp 98.1°F | Resp 16 | Ht 64.0 in | Wt 178.4 lb

## 2015-06-10 DIAGNOSIS — E119 Type 2 diabetes mellitus without complications: Secondary | ICD-10-CM | POA: Diagnosis not present

## 2015-06-10 DIAGNOSIS — F411 Generalized anxiety disorder: Secondary | ICD-10-CM

## 2015-06-10 DIAGNOSIS — E7800 Pure hypercholesterolemia, unspecified: Secondary | ICD-10-CM | POA: Diagnosis not present

## 2015-06-10 DIAGNOSIS — I10 Essential (primary) hypertension: Secondary | ICD-10-CM | POA: Diagnosis not present

## 2015-06-10 MED ORDER — VALSARTAN 320 MG PO TABS: 320.0000 mg | ORAL_TABLET | Freq: Every day | ORAL | Status: DC

## 2015-06-10 MED ORDER — FLUOXETINE HCL 20 MG PO TABS: 20.0000 mg | ORAL_TABLET | Freq: Every day | ORAL | Status: DC

## 2015-06-10 MED ORDER — HYDROCHLOROTHIAZIDE 12.5 MG PO TABS: 12.5000 mg | ORAL_TABLET | Freq: Every day | ORAL | Status: DC

## 2015-06-10 MED ORDER — CLONAZEPAM 0.5 MG PO TABS: 0.5000 mg | ORAL_TABLET | Freq: Two times a day (BID) | ORAL | Status: DC

## 2015-06-10 NOTE — Progress Notes (Signed)
   Subjective:    Patient ID: Cynthia Masonlementine Stewart, female    DOB: Oct 03, 1950, 65 y.o.   MRN: [id number] By signing my name below, I, Littie Deedsichard Sun, attest that this documentation has been prepared under the direction and in the presence of Elvina SidleKurt Terriona Horlacher, MD.  Electronically Signed: Littie Deedsichard Sun, Medical Scribe. 06/10/2015. 3:27 PM.  HPI HPI Comments: Cynthia MasonClementine Othman is a 65 y.o. female with a history of hypertension who presents to the [facility] for clearance to return to work. Patient feels fine overall but still has some stress at work. She is also requesting a refill for valsartan and HCTZ. She was last seen here by Dr. Conley RollsLe on 2/27.  Patient works at [employer]. She moved here from [place] a few years ago when she transferred to the [place] location within the same company. She likes her work and did not have any problems at work until she transferred to this location.  Review of Systems     Objective:   Physical Exam CONSTITUTIONAL: Well developed/well nourished HEAD: Normocephalic/atraumatic EYES: EOM/PERRL ENMT: Mucous membranes moist NECK: supple no meningeal signs SPINE: entire spine nontender CV: S1/S2 noted, no murmurs/rubs/gallops noted LUNGS: Lungs are clear to auscultation bilaterally, no apparent distress ABDOMEN: soft, nontender, no rebound or guarding GU: no cva tenderness NEURO: Pt is awake/alert, moves all extremitiesx4 EXTREMITIES: pulses normal, full ROM SKIN: warm, color normal PSYCH: no abnormalities of mood noted  Results for orders placed or performed in visit on 05/18/15  COMPLETE METABOLIC PANEL WITH GFR  Result Value Ref Range   Sodium 138 135 - 146 mmol/L   Potassium 4.0 3.5 - 5.3 mmol/L   Chloride 100 98 - 110 mmol/L   CO2 28 20 - 31 mmol/L   Glucose, Bld 134 (H) 65 - 99 mg/dL   BUN 13 7 - 25 mg/dL   Creat 4.090.91 8.110.50 - 9.140.99 mg/dL   Total Bilirubin 0.7 0.2 - 1.2 mg/dL   Alkaline Phosphatase 63 33 - 130 U/L   AST 23 10 - 35 U/L   ALT  21 6 - 29 U/L   Total Protein 7.7 6.1 - 8.1 g/dL   Albumin 4.1 3.6 - 5.1 g/dL   Calcium 9.7 8.6 - 78.210.4 mg/dL   GFR, Est African American 78 >=60 mL/min   GFR, Est Non African American 68 >=60 mL/min  POCT glycosylated hemoglobin (Hb A1C)  Result Value Ref Range   Hemoglobin A1C 7.0          Assessment & Plan:   This chart was scribed in my presence and reviewed by me personally.    ICD-9-CM ICD-10-CM   1. Anxiety state 300.00 F41.1 FLUoxetine (PROZAC) 20 MG tablet     clonazePAM (KLONOPIN) 0.5 MG tablet  2. Essential hypertension 401.9 I10 FLUoxetine (PROZAC) 20 MG tablet     clonazePAM (KLONOPIN) 0.5 MG tablet  3. Accelerated hypertension 401.0 I10 FLUoxetine (PROZAC) 20 MG tablet     clonazePAM (KLONOPIN) 0.5 MG tablet  4. Elevated LDL cholesterol level 272.0 E78.00 FLUoxetine (PROZAC) 20 MG tablet     clonazePAM (KLONOPIN) 0.5 MG tablet  5. Type 2 diabetes mellitus without complication, without long-term current use of insulin (HCC) 250.00 E11.9 FLUoxetine (PROZAC) 20 MG tablet     clonazePAM (KLONOPIN) 0.5 MG tablet     Signed, Elvina SidleKurt Ryon Layton, MD

## 2015-06-10 NOTE — Patient Instructions (Signed)
     IF you received an x-ray today, you will receive an invoice from [facility]. Please contact [facility] at [phone] with questions or concerns regarding your invoice.   IF you received labwork today, you will receive an invoice from [facility]. Please contact [facility] at [phone] with questions or concerns regarding your invoice.   Our billing staff will not be able to assist you with questions regarding bills from these companies.  You will be contacted with the lab results as soon as they are available. The fastest way to get your results is to activate your My Chart account. Instructions are located on the last page of this paperwork. If you have not heard from us regarding the results in 2 weeks, please contact this office.      

## 2015-06-11 ENCOUNTER — Telehealth: Payer: Self-pay

## 2015-06-11 NOTE — Telephone Encounter (Signed)
Patient needs Attending Physician statement completed by Dr. Elbert EwingsL. I have completed what I could from the OV notes and highlighted what needs to be finished. I will place in your box on 06/11/15 if you could complete and return to the FMLA/Disability box at the 102 checkout desk within 5-7 business days. Thank you!

## 2015-06-12 NOTE — Telephone Encounter (Signed)
Paperwork completed and faxed to [employer] on 06/12/15.

## 2015-08-12 ENCOUNTER — Telehealth: Payer: Self-pay | Admitting: Family Medicine

## 2015-08-12 NOTE — Telephone Encounter (Signed)
Pt came into our office wanting to see Cynthia Stewart about payment arrangements some told her to come to speak with her please call pt at [phone] she made a $50 payment today

## 2015-08-13 ENCOUNTER — Telehealth: Payer: Self-pay

## 2015-08-13 NOTE — Telephone Encounter (Signed)
Called pt back to make Payment arrangement

## 2015-08-13 NOTE — Telephone Encounter (Signed)
Patient wanted to see if an antibiotic could be called in for her Allergies / sinus infection she gets every couple of months.   Symptoms are upper jaw and teeth pain, ear pain, and equilibrium off which she has been treating with over the countner medication that is no longer working.  I advised patient someone would let her know if she needs to come in to see a provider or if medication could be called into the pharmacy.  Pharmacy - [id number]- [place], [place] - [phone]  Patient Number - [phone]

## 2015-08-14 NOTE — Telephone Encounter (Signed)
Advise patient that we do not treat via phone.. If concern with sinus infection appointment is needed. Patient agreed.

## 2015-09-10 ENCOUNTER — Ambulatory Visit (INDEPENDENT_AMBULATORY_CARE_PROVIDER_SITE_OTHER): Payer: BLUE CROSS/BLUE SHIELD | Admitting: Physician Assistant

## 2015-09-10 VITALS — BP 120/80 | HR 76 | Temp 98.9°F | Resp 18 | Ht 64.0 in | Wt 176.0 lb

## 2015-09-10 DIAGNOSIS — M542 Cervicalgia: Secondary | ICD-10-CM | POA: Diagnosis not present

## 2015-09-10 DIAGNOSIS — Z6830 Body mass index (BMI) 30.0-30.9, adult: Secondary | ICD-10-CM | POA: Insufficient documentation

## 2015-09-10 DIAGNOSIS — J019 Acute sinusitis, unspecified: Secondary | ICD-10-CM | POA: Diagnosis not present

## 2015-09-10 DIAGNOSIS — G44209 Tension-type headache, unspecified, not intractable: Secondary | ICD-10-CM | POA: Diagnosis not present

## 2015-09-10 MED ORDER — PREDNISONE 20 MG PO TABS: ORAL_TABLET | ORAL | Status: DC

## 2015-09-10 MED ORDER — AMOXICILLIN-POT CLAVULANATE 875-125 MG PO TABS: 1.0000 | ORAL_TABLET | Freq: Two times a day (BID) | ORAL | Status: AC

## 2015-09-10 NOTE — Progress Notes (Signed)
Patient ID: Cynthia MasonClementine Rumler, female    DOB: February 20, 1951, 65 y.o.   MRN: [id number]  PCP: Mervin Hackerry Le, MD (has left practice)   Subjective:   Chief Complaint  Patient presents with  . Neck Pain  . Shoulder Pain    HPI Presents for evaluation of head pain x 5 weeks.  She initially thought it was sinus related, as she has had terrible allergies all her life, and worse since moving to [place].   The pain is mostly in the back of her head, and is now down into her shoulders, R>L.  The has fullness in her face and her teeth hurt on the RIGHT. Recent dental visit was good-no dental problems identified that would cause the symptoms.  Describes dizziness/swimmy headed feeling intermittently, a "horrible taste" in her mouth and the sensation that her ears are draining into the "back of the jaw" on the RIGHT. "I just don't feel good." Intermittent chills and sweats. Had one episode of nausea last week, and 2 this week, but no vomiting. No urinary symptoms. She has no change in her baseline constipation. Difficult to focus.  Uses a Neti Pot BID. Allegra QAM.  Singulair 10 mg. Recalls having used a steroid nasal spray in the past without much benefit. She was previously followed at [facility], and immunotherapy was recommended, but it wasn't covered by her insurance. She has not established with an ENT specialist locally [place]     Review of Systems As above.    Patient Active Problem List   Diagnosis Date Noted  . BMI 30.0-30.9,adult 09/10/2015  . Elevated hemoglobin A1c measurement 02/07/2014  . Elevated LDL cholesterol level 02/07/2014  . Environmental and seasonal allergies 02/07/2014  . Essential hypertension 01/15/2014  . DD (diverticular disease) 12/27/2011  . Hot flash, menopausal 11/23/2010  . Acute stress disorder 11/23/2010     Prior to Admission medications   Medication Sig Start Date End Date Taking? Authorizing Provider  azelastine (ASTELIN)  0.1 % nasal spray Place 2 sprays into both nostrils 2 (two) times daily. Use in each nostril as directed 02/07/14  Yes Ofilia NeasMichael L Clark, PA-C  clonazePAM (KLONOPIN) 0.5 MG tablet Take 1 tablet (0.5 mg total) by mouth 2 (two) times daily. Do not take ativan with this. 06/10/15  Yes Elvina SidleKurt Lauenstein, MD  estradiol (VIVELLE-DOT) 0.05 MG/24HR patch Place 1 patch (0.05 mg total) onto the skin 2 (two) times a week. 03/13/15  Yes Thao P Le, DO  hydrochlorothiazide (HYDRODIURIL) 12.5 MG tablet Take 1 tablet (12.5 mg total) by mouth daily. 06/10/15  Yes Elvina SidleKurt Lauenstein, MD  montelukast (SINGULAIR) 10 MG tablet Take 1 tablet (10 mg total) by mouth at bedtime. 03/24/15  Yes Wallis BambergMario Mani, PA-C  valsartan (DIOVAN) 320 MG tablet Take 1 tablet (320 mg total) by mouth daily. 06/10/15  Yes Elvina SidleKurt Lauenstein, MD  amoxicillin-clavulanate (AUGMENTIN) 875-125 MG tablet Take 1 tablet by mouth 2 (two) times daily. 09/10/15 09/20/15  Ghalia Reicks, PA-C  predniSONE (DELTASONE) 20 MG tablet Take 3 PO QAM x3days, 2 PO QAM x3days, 1 PO QAM x3days 09/10/15   Abbigail Anstey, PA-C     Allergies  Allergen Reactions  . Codeine Other (See Comments)    HALLUCINATION  . Percocet [Oxycodone-Acetaminophen] Itching and Other (See Comments)    HALLUCINATION  . Trileptal [Oxcarbazepine]        Objective:  Physical Exam  Constitutional: She is oriented to person, place, and time. She appears well-developed and well-nourished. She is active  and cooperative. No distress.  BP 120/80 mmHg  Pulse 76  Temp(Src) 98.9 F (37.2 C) (Oral)  Resp 18  Ht 5\' 4"  (1.626 m)  Wt 176 lb (79.833 kg)  BMI 30.20 kg/m2  SpO2 99%  HENT:  Head: Normocephalic and atraumatic.  Right Ear: Hearing, tympanic membrane, external ear and ear canal normal.  Left Ear: Hearing, tympanic membrane, external ear and ear canal normal.  Nose: Mucosal edema present. No rhinorrhea. Right sinus exhibits maxillary sinus tenderness and frontal sinus tenderness. Left sinus  exhibits maxillary sinus tenderness and frontal sinus tenderness.  Mouth/Throat: Uvula is midline, oropharynx is clear and moist and mucous membranes are normal. No oral lesions. Normal dentition. No uvula swelling.  Eyes: Conjunctivae and EOM are normal. Pupils are equal, round, and reactive to light. No scleral icterus.  Neck: Normal range of motion, full passive range of motion without pain and phonation normal. Neck supple. No thyromegaly present.  Cardiovascular: Normal rate, regular rhythm, normal heart sounds and intact distal pulses.   Pulses:      Radial pulses are 2+ on the right side, and 2+ on the left side.  Pulmonary/Chest: Effort normal and breath sounds normal.  Lymphadenopathy:       Head (right side): No tonsillar, no preauricular, no posterior auricular and no occipital adenopathy present.       Head (left side): No tonsillar, no preauricular, no posterior auricular and no occipital adenopathy present.    She has no cervical adenopathy.       Right: No supraclavicular adenopathy present.       Left: No supraclavicular adenopathy present.  Neurological: She is alert and oriented to person, place, and time. She has normal strength. No sensory deficit. She displays a negative Romberg sign.  Skin: Skin is warm, dry and intact. No rash noted. No cyanosis or erythema. Nails show no clubbing.  Psychiatric: She has a normal mood and affect. Her speech is normal and behavior is normal.           Assessment & Plan:   1. Neck pain 2. Tension-type headache, not intractable, unspecified chronicity pattern 3. Subacute sinusitis, unspecified location Once she is well, consider addition of steroid nasal spray if her allergies are not adequately controlled. She will decide if she would like to see ENT again. In the meantime, she will investigate in-network providers with her insurance. - amoxicillin-clavulanate (AUGMENTIN) 875-125 MG tablet; Take 1 tablet by mouth 2 (two) times daily.   Dispense: 20 tablet; Refill: 0 - predniSONE (DELTASONE) 20 MG tablet; Take 3 PO QAM x3days, 2 PO QAM x3days, 1 PO QAM x3days  Dispense: 18 tablet; Refill: 0   She has a history of elevated A1C, and has been working on lifestyle changes with Dr. Conley RollsLe. She will return in the next several months for follow-up of that.   Fernande Brashelle S. Pakou Rainbow, PA-C Physician Assistant-Certified [facility]

## 2015-09-10 NOTE — Patient Instructions (Addendum)
Get plenty of rest and drink at least 64 ounces of water daily.     IF you received an x-ray today, you will receive an invoice from [facility]. Please contact [facility] at [phone] with questions or concerns regarding your invoice.   IF you received labwork today, you will receive an invoice from [facility]. Please contact [facility] at [phone] with questions or concerns regarding your invoice.   Our billing staff will not be able to assist you with questions regarding bills from these companies.  You will be contacted with the lab results as soon as they are available. The fastest way to get your results is to activate your My Chart account. Instructions are located on the last page of this paperwork. If you have not heard from us regarding the results in 2 weeks, please contact this office.     

## 2015-12-01 ENCOUNTER — Other Ambulatory Visit: Payer: Self-pay | Admitting: Urgent Care

## 2015-12-01 DIAGNOSIS — J302 Other seasonal allergic rhinitis: Secondary | ICD-10-CM

## 2015-12-01 NOTE — Telephone Encounter (Signed)
Patient saw you in June, it doesn't look like singular has been prescribed in awhile.  Can we refill, per note suggested follow up with allergist.  Patient has done this yet.

## 2015-12-16 ENCOUNTER — Ambulatory Visit (INDEPENDENT_AMBULATORY_CARE_PROVIDER_SITE_OTHER): Payer: BLUE CROSS/BLUE SHIELD | Admitting: Physician Assistant

## 2015-12-16 VITALS — BP 126/84 | HR 75 | Temp 98.3°F | Resp 18 | Ht 64.0 in | Wt 179.0 lb

## 2015-12-16 DIAGNOSIS — J069 Acute upper respiratory infection, unspecified: Secondary | ICD-10-CM | POA: Diagnosis not present

## 2015-12-16 MED ORDER — AZITHROMYCIN 250 MG PO TABS: ORAL_TABLET | ORAL | 0 refills | Status: DC

## 2015-12-16 NOTE — Patient Instructions (Signed)
     IF you received an x-ray today, you will receive an invoice from [facility]. Please contact  Radiology at [phone] with questions or concerns regarding your invoice.   IF you received labwork today, you will receive an invoice from [facility]. Please contact [facility] at [phone] with questions or concerns regarding your invoice.   Our billing staff will not be able to assist you with questions regarding bills from these companies.  You will be contacted with the lab results as soon as they are available. The fastest way to get your results is to activate your My Chart account. Instructions are located on the last page of this paperwork. If you have not heard from us regarding the results in 2 weeks, please contact this office.      

## 2015-12-16 NOTE — Progress Notes (Signed)
   12/18/2015 1:06 PM   DOB: 10/14/1950 / MRN: [id number]  SUBJECTIVE:  Cynthia Stewart is a 65 y.o. female presenting for sinus congestion that started about 4 weeks ago.  Associates post nasal drip and feels that her ears are "clogged.". She is having some teeth pain bilaterally.  She denies fevers, but associates some sweats and chills.  She feels that she is getting worse. She takes allegra daily and singulair 10 at night and has not missed doses.    She is allergic to codeine; percocet [oxycodone-acetaminophen]; and trileptal [oxcarbazepine].   She  has a past medical history of Allergy; Hyperlipidemia; and Hypertension.    She  reports that she has never smoked. She has never used smokeless tobacco. She reports that she does not drink alcohol. She  reports that she currently engages in sexual activity. The patient  has a past surgical history that includes Abdominal hysterectomy; Appendectomy; and Cholecystectomy.  Her family history includes Diabetes in her mother; Hyperlipidemia in her mother and sister; Hypertension in her brother, mother, and sister.  ROS  PER HPI  The problem list and medications were reviewed and updated by myself where necessary and exist elsewhere in the encounter.   OBJECTIVE:  BP 126/84 (BP Location: Right Arm, Patient Position: Sitting, Cuff Size: Small)   Pulse 75   Temp 98.3 F (36.8 C) (Oral)   Resp 18   Ht 5\' 4"  (1.626 m)   Wt 179 lb (81.2 kg)   SpO2 96%   BMI 30.73 kg/m   Physical Exam  Constitutional: She is oriented to person, place, and time. She appears well-nourished. No distress.  HENT:  Nose: Nose normal.  Mouth/Throat: Uvula is midline, oropharynx is clear and moist and mucous membranes are normal.  Eyes: EOM are normal. Pupils are equal, round, and reactive to light.  Cardiovascular: Normal rate and regular rhythm.   Pulmonary/Chest: Effort normal and breath sounds normal.  Abdominal: She exhibits no distension.  Neurological:  She is alert and oriented to person, place, and time. No cranial nerve deficit. Gait normal.  Skin: Skin is dry. She is not diaphoretic.  Psychiatric: She has a normal mood and affect.  Vitals reviewed.   No results found for this or any previous visit (from the past 72 hour(s)).  No results found.  ASSESSMENT AND PLAN  Cynthia Stewart was seen today for sinusitis and ear pain.  Diagnoses and all orders for this visit:  Acute URI: bSymptoms present for about two weeks and she is taking allergy meds per her normal.  She has features of ABRS.  Will try her on Azithromycin, and if this fails will consider Augmentin.   -     azithromycin (ZITHROMAX) 250 MG tablet; Take two tablets on day one, and one daily thereafter.    The patient is advised to call or return to clinic if she does not see an improvement in symptoms, or to seek the care of the closest emergency department if she worsens with the above plan.   Deliah BostonMichael Finley Chevez, MHS, PA-C [facility] 12/18/2015 1:06 PM

## 2016-01-01 ENCOUNTER — Telehealth: Payer: Self-pay

## 2016-01-01 NOTE — Telephone Encounter (Signed)
Patient was recently seen and states she was told that if the zpak doesn't work  to leave a message and we can send something different. Patient stated that the Zpak isn't working and all of her symptoms came back after she finished taking it. Please advise!  [phone]  [facility] [place]

## 2016-01-04 ENCOUNTER — Other Ambulatory Visit: Payer: Self-pay | Admitting: *Deleted

## 2016-01-04 MED ORDER — AMOXICILLIN-POT CLAVULANATE 875-125 MG PO TABS: 1.0000 | ORAL_TABLET | Freq: Two times a day (BID) | ORAL | 0 refills | Status: DC

## 2016-01-04 NOTE — Telephone Encounter (Signed)
Please call in Augmentin [phone] bid #[phone].   Please let patient know as well.  RTC if not improved after this round.  Deliah BostonMichael Clark, MS, PA-C 1:48 PM, 01/04/2016

## 2016-02-09 ENCOUNTER — Ambulatory Visit (INDEPENDENT_AMBULATORY_CARE_PROVIDER_SITE_OTHER): Payer: BLUE CROSS/BLUE SHIELD | Admitting: Physician Assistant

## 2016-02-09 ENCOUNTER — Encounter: Payer: Self-pay | Admitting: Physician Assistant

## 2016-02-09 VITALS — BP 128/80 | HR 83 | Temp 98.2°F | Resp 18 | Ht 64.0 in | Wt 178.0 lb

## 2016-02-09 DIAGNOSIS — Z23 Encounter for immunization: Secondary | ICD-10-CM | POA: Diagnosis not present

## 2016-02-09 DIAGNOSIS — Z0000 Encounter for general adult medical examination without abnormal findings: Secondary | ICD-10-CM | POA: Diagnosis not present

## 2016-02-09 DIAGNOSIS — Z6830 Body mass index (BMI) 30.0-30.9, adult: Secondary | ICD-10-CM

## 2016-02-09 DIAGNOSIS — Z114 Encounter for screening for human immunodeficiency virus [HIV]: Secondary | ICD-10-CM | POA: Diagnosis not present

## 2016-02-09 DIAGNOSIS — I10 Essential (primary) hypertension: Secondary | ICD-10-CM

## 2016-02-09 DIAGNOSIS — E7800 Pure hypercholesterolemia, unspecified: Secondary | ICD-10-CM

## 2016-02-09 DIAGNOSIS — R7309 Other abnormal glucose: Secondary | ICD-10-CM | POA: Diagnosis not present

## 2016-02-09 DIAGNOSIS — J302 Other seasonal allergic rhinitis: Secondary | ICD-10-CM | POA: Diagnosis not present

## 2016-02-09 DIAGNOSIS — R232 Flushing: Secondary | ICD-10-CM

## 2016-02-09 DIAGNOSIS — F411 Generalized anxiety disorder: Secondary | ICD-10-CM

## 2016-02-09 LAB — CBC WITH DIFFERENTIAL/PLATELET
BASOS ABS: 0 {cells}/uL (ref 0–200)
Basophils Relative: 0 %
EOS ABS: 100 {cells}/uL (ref 15–500)
Eosinophils Relative: 2 %
HCT: 43.6 % (ref 35.0–45.0)
HEMOGLOBIN: 14.4 g/dL (ref 11.7–15.5)
LYMPHS ABS: 2750 {cells}/uL (ref 850–3900)
Lymphocytes Relative: 55 %
MCH: 29 pg (ref 27.0–33.0)
MCHC: 33 g/dL (ref 32.0–36.0)
MCV: 87.9 fL (ref 80.0–100.0)
MPV: 12.1 fL (ref 7.5–12.5)
Monocytes Absolute: 350 cells/uL (ref 200–950)
Monocytes Relative: 7 %
NEUTROS ABS: 1800 {cells}/uL (ref 1500–7800)
Neutrophils Relative %: 36 %
Platelets: 229 10*3/uL (ref 140–400)
RBC: 4.96 MIL/uL (ref 3.80–5.10)
RDW: 14.2 % (ref 11.0–15.0)
WBC: 5 10*3/uL (ref 3.8–10.8)

## 2016-02-09 LAB — COMPREHENSIVE METABOLIC PANEL
ALT: 17 U/L (ref 6–29)
AST: 21 U/L (ref 10–35)
Albumin: 4.1 g/dL (ref 3.6–5.1)
Alkaline Phosphatase: 60 U/L (ref 33–130)
BUN: 13 mg/dL (ref 7–25)
CALCIUM: 9.6 mg/dL (ref 8.6–10.4)
CO2: 28 mmol/L (ref 20–31)
Chloride: 101 mmol/L (ref 98–110)
Creat: 1.03 mg/dL — ABNORMAL HIGH (ref 0.50–0.99)
GLUCOSE: 126 mg/dL — AB (ref 65–99)
POTASSIUM: 4.2 mmol/L (ref 3.5–5.3)
Sodium: 138 mmol/L (ref 135–146)
Total Bilirubin: 0.8 mg/dL (ref 0.2–1.2)
Total Protein: 7.3 g/dL (ref 6.1–8.1)

## 2016-02-09 LAB — POCT URINALYSIS DIP (MANUAL ENTRY)
Bilirubin, UA: NEGATIVE
GLUCOSE UA: NEGATIVE
Ketones, POC UA: NEGATIVE
Leukocytes, UA: NEGATIVE
NITRITE UA: NEGATIVE
PROTEIN UA: NEGATIVE
Spec Grav, UA: 1.01
UROBILINOGEN UA: 0.2
pH, UA: 5

## 2016-02-09 LAB — LIPID PANEL
CHOL/HDL RATIO: 3.2 ratio (ref <5.0)
CHOLESTEROL: 285 mg/dL — AB (ref <200)
HDL: 88 mg/dL (ref >50)
LDL Cholesterol: 183 mg/dL — ABNORMAL HIGH (ref <100)
TRIGLYCERIDES: 72 mg/dL (ref <150)
VLDL: 14 mg/dL (ref <30)

## 2016-02-09 LAB — HIV ANTIBODY (ROUTINE TESTING W REFLEX): HIV: NONREACTIVE

## 2016-02-09 LAB — POCT GLYCOSYLATED HEMOGLOBIN (HGB A1C): Hemoglobin A1C: 6.8

## 2016-02-09 LAB — TSH: TSH: 1.44 mIU/L

## 2016-02-09 MED ORDER — MONTELUKAST SODIUM 10 MG PO TABS: 10.0000 mg | ORAL_TABLET | Freq: Every day | ORAL | 3 refills | Status: DC

## 2016-02-09 MED ORDER — AZELASTINE HCL 0.1 % NA SOLN: 2.0000 | Freq: Two times a day (BID) | NASAL | 12 refills | Status: AC

## 2016-02-09 MED ORDER — HYDROCHLOROTHIAZIDE 12.5 MG PO TABS: 12.5000 mg | ORAL_TABLET | Freq: Every day | ORAL | 3 refills | Status: DC

## 2016-02-09 MED ORDER — LORAZEPAM 0.5 MG PO TABS: 0.5000 mg | ORAL_TABLET | Freq: Two times a day (BID) | ORAL | 0 refills | Status: DC | PRN

## 2016-02-09 MED ORDER — VALSARTAN 320 MG PO TABS: 320.0000 mg | ORAL_TABLET | Freq: Every day | ORAL | 3 refills | Status: DC

## 2016-02-09 MED ORDER — ROSUVASTATIN CALCIUM 5 MG PO TABS: 5.0000 mg | ORAL_TABLET | Freq: Every day | ORAL | 3 refills | Status: DC

## 2016-02-09 MED ORDER — ESTRADIOL 0.05 MG/24HR TD PTTW: 1.0000 | MEDICATED_PATCH | TRANSDERMAL | 0 refills | Status: DC

## 2016-02-09 NOTE — Patient Instructions (Addendum)
Drink at least 64 ounces of water daily. Consider a humidifier for the room where you sleep. Bathe once daily. Avoid using HOT water, as it dries skin. Avoid deodorant soaps (Dial is the worst!) and stick with gentle cleansers (I like Cetaphil Liquid Cleanser). After bathing, dry off completely, then apply a thick emollient cream (I like Cetaphil Moisturizing Cream). Apply the cream twice daily, or more!    IF you received an x-ray today, you will receive an invoice from [facility]. Please contact [facility] at [phone] with questions or concerns regarding your invoice.   IF you received labwork today, you will receive an invoice from [employer]. Please contact [facility] at [phone] with questions or concerns regarding your invoice.   Our billing staff will not be able to assist you with questions regarding bills from these companies.  You will be contacted with the lab results as soon as they are available. The fastest way to get your results is to activate your My Chart account. Instructions are located on the last page of this paperwork. If you have not heard from [place] regarding the results in 2 weeks, please contact this office.     Keeping You Healthy  Get These Tests  Blood Pressure- Have your blood pressure checked by your healthcare provider at least once a year.  Normal blood pressure is 120/80.  Weight- Have your body mass index (BMI) calculated to screen for obesity.  BMI is a measure of body fat based on height and weight.  You can calculate your own BMI at [employer]velB[employer].it  Cholesterol- Have your cholesterol checked every year.  Diabetes- Have your blood sugar checked every year if you have high blood pressure, high cholesterol, a family history of diabetes or if you are overweight.  Pap Test - Have a pap test every 1 to 5 years if you have been sexually active.  If you are older than 65 and recent pap tests  have been normal you may not need additional pap tests.  In addition, if you have had a hysterectomy  for benign disease additional pap tests are not necessary.  Mammogram-Yearly mammograms are essential for early detection of breast cancer  Screening for Colon Cancer- Colonoscopy starting at age 69. Screening may begin sooner depending on your family history and other health conditions.  Follow up colonoscopy as directed by your Gastroenterologist.  Screening for Osteoporosis- Screening begins at age 72 with bone density scanning, sooner if you are at higher risk for developing Osteoporosis.  Get these medicines  Calcium with Vitamin D- Your body requires 1200-1500 mg of Calcium a day and [phone] IU of Vitamin D a day.  You can only absorb 500 mg of Calcium at a time therefore Calcium must be taken in 2 or 3 separate doses throughout the day.  Hormones- Hormone therapy has been associated with increased risk for certain cancers and heart disease.  Talk to your healthcare provider about if you need relief from menopausal symptoms.  Aspirin- Ask your healthcare provider about taking Aspirin to prevent Heart Disease and Stroke.  Get these Immuniztions  Flu shot- Every fall  Pneumonia shot- Once after the age of 50; if you are younger ask your healthcare provider if you need a pneumonia shot.  Tetanus- Every ten years.  Zostavax- Once after the age of 35 to prevent shingles.  Take these steps  Don't smoke- Your healthcare provider can help you quit. For tips on how to quit, ask your healthcare  provider or go to www.smokefree.gov or call 1-800 QUIT-NOW.  Be physically active- Exercise 5 days a week for a minimum of 30 minutes.  If you are not already physically active, start slow and gradually work up to 30 minutes of moderate physical activity.  Try walking, dancing, bike riding, swimming, etc.  Eat a healthy diet- Eat a variety of healthy foods such as fruits, vegetables, whole grains,  low fat milk, low fat cheeses, yogurt, lean meats, chicken, fish, eggs, dried beans, tofu, etc.  For more information go to www.thenutritionsource.org  Dental visit- Brush and floss teeth twice daily; visit your dentist twice a year.  Eye exam- Visit your Optometrist or Ophthalmologist yearly.  Drink alcohol in moderation- Limit alcohol intake to one drink or less a day.  Never drink and drive.  Depression- Your emotional health is as important as your physical health.  If you're feeling down or losing interest in things you normally enjoy, please talk to your healthcare provider.  Seat Belts- can save your life; always wear one  Smoke/Carbon Monoxide detectors- These detectors need to be installed on the appropriate level of your home.  Replace batteries at least once a year.  Violence- If anyone is threatening or hurting you, please tell your healthcare provider.  Living Will/ Health care power of attorney- Discuss with your healthcare provider and family.   Results for orders placed or performed in visit on 02/09/16  Comprehensive metabolic panel  Result Value Ref Range   Sodium 138 135 - 146 mmol/L   Potassium 4.2 3.5 - 5.3 mmol/L   Chloride 101 98 - 110 mmol/L   CO2 28 20 - 31 mmol/L   Glucose, Bld 126 (H) 65 - 99 mg/dL   BUN 13 7 - 25 mg/dL   Creat 1.03 (H) 0.50 - 0.99 mg/dL   Total Bilirubin 0.8 0.2 - 1.2 mg/dL   Alkaline Phosphatase 60 33 - 130 U/L   AST 21 10 - 35 U/L   ALT 17 6 - 29 U/L   Total Protein 7.3 6.1 - 8.1 g/dL   Albumin 4.1 3.6 - 5.1 g/dL   Calcium 9.6 8.6 - 10.4 mg/dL  CBC with Differential/Platelet  Result Value Ref Range   WBC 5.0 3.8 - 10.8 K/uL   RBC 4.96 3.80 - 5.10 MIL/uL   Hemoglobin 14.4 11.7 - 15.5 g/dL   HCT 43.6 35.0 - 45.0 %   MCV 87.9 80.0 - 100.0 fL   MCH 29.0 27.0 - 33.0 pg   MCHC 33.0 32.0 - 36.0 g/dL   RDW 14.2 11.0 - 15.0 %   Platelets 229 140 - 400 K/uL   MPV 12.1 7.5 - 12.5 fL   Neutro Abs 1,800 1,500 - 7,800 cells/uL   Lymphs  Abs 2,750 850 - 3,900 cells/uL   Monocytes Absolute 350 200 - 950 cells/uL   Eosinophils Absolute 100 15 - 500 cells/uL   Basophils Absolute 0 0 - 200 cells/uL   Neutrophils Relative % 36 %   Lymphocytes Relative 55 %   Monocytes Relative 7 %   Eosinophils Relative 2 %   Basophils Relative 0 %   Smear Review Criteria for review not met   HIV antibody  Result Value Ref Range   HIV 1&2 Ab, 4th Generation NONREACTIVE NONREACTIVE  TSH  Result Value Ref Range   TSH PENDING mIU/L  Lipid panel  Result Value Ref Range   Cholesterol 285 (H) <200 mg/dL   Triglycerides 72 <150 mg/dL  HDL 88 >50 mg/dL   Total CHOL/HDL Ratio 3.2 <5.0 Ratio   VLDL 14 <30 mg/dL   LDL Cholesterol 183 (H) <100 mg/dL  POCT glycosylated hemoglobin (Hb A1C)  Result Value Ref Range   Hemoglobin A1C 6.8

## 2016-02-09 NOTE — Progress Notes (Signed)
Patient ID: Cynthia Stewart, female    DOB: November 13, 1950, 65 y.o.   MRN: [id number]  PCP: Harrison Mons, PA-C, as of today. Previously followed by Dr. Marin Comment.  Chief Complaint  Patient presents with  . Annual Exam    CPE without pap.    Subjective:   Presents for [employer].  Cervical Cancer Screening: cytology negative 02/10/2015, plan co-testing in 2019 Breast Cancer Screening: mammogram 02/28/2015 Colorectal Cancer Screening: has had colonoscopy, ?2013 Bone Density Testing: not yet HIV Screening: today STI Screening: very low risk Seasonal Influenza Vaccination: today Td/Tdap Vaccination: 02/08/2015 Pneumococcal Vaccination: Pneumovax-23 today Zoster Vaccination: not yet (not covered by her insurance) Frequency of Dental evaluation: Q6 months Frequency of Eye evaluation: annually    Patient Active Problem List   Diagnosis Date Noted  . BMI 30.0-30.9,adult 09/10/2015  . Elevated hemoglobin A1c measurement 02/07/2014  . Elevated LDL cholesterol level 02/07/2014  . Environmental and seasonal allergies 02/07/2014  . Essential hypertension 01/15/2014  . DD (diverticular disease) 12/27/2011  . Hot flash, menopausal 11/23/2010  . Acute stress disorder 11/23/2010    Past Medical History:  Diagnosis Date  . Allergy   . Hyperlipidemia   . Hypertension      Prior to Admission medications   Medication Sig Start Date End Date Taking? Authorizing Provider  azelastine (ASTELIN) 0.1 % nasal spray Place 2 sprays into both nostrils 2 (two) times daily. Use in each nostril as directed Patient not taking: Reported on 12/16/2015 02/07/14   Tereasa Coop, PA-C  clonazePAM (KLONOPIN) 0.5 MG tablet Take 1 tablet (0.5 mg total) by mouth 2 (two) times daily. Do not take ativan with this. 06/10/15   Robyn Haber, MD  estradiol (VIVELLE-DOT) 0.05 MG/24HR patch Place 1 patch (0.05 mg total) onto the skin 2 (two) times a week. 03/13/15   Thao P Le, DO  hydrochlorothiazide  (HYDRODIURIL) 12.5 MG tablet Take 1 tablet (12.5 mg total) by mouth daily. 06/10/15   Robyn Haber, MD  montelukast (SINGULAIR) 10 MG tablet TAKE 1 TABLET (10 MG TOTAL) BY MOUTH AT BEDTIME. 12/01/15   Ruthy Forry, PA-C  valsartan (DIOVAN) 320 MG tablet Take 1 tablet (320 mg total) by mouth daily. 06/10/15   Robyn Haber, MD    Allergies  Allergen Reactions  . Codeine Other (See Comments)    HALLUCINATION  . Percocet [Oxycodone-Acetaminophen] Itching and Other (See Comments)    HALLUCINATION  . Trileptal [Oxcarbazepine]     Past Surgical History:  Procedure Laterality Date  . ABDOMINAL HYSTERECTOMY    . APPENDECTOMY    . CHOLECYSTECTOMY      Family History  Problem Relation Age of Onset  . Hypertension Mother   . Diabetes Mother   . Hyperlipidemia Mother   . Hyperlipidemia Sister   . Hypertension Sister   . Hypertension Brother     Social History   Social History  . Marital status: Married    Spouse name: Jeneen Rinks  . Number of children: 3  . Years of education: High School   Occupational History  . 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 Smoking status: Former Research scientist (life sciences)  . Smokeless tobacco: Never Used     Comment: as a teenager  . Alcohol use No  . Drug use: No  . Sexual activity: Yes    Partners: Male    Birth control/ protection: Surgical   Other Topics Concern  . None   Social History Narrative   Lives with her  husband.   Daughters live in [place], [place], [place], [place] and [place], [place]    Exercise: No.       Review of Systems  Constitutional: Negative.   HENT: Negative.   Eyes: Negative.   Respiratory: Negative.   Cardiovascular: Negative.   Gastrointestinal: Negative.   Genitourinary: Negative.   Musculoskeletal: Negative.   Skin: Negative.   Neurological: Negative.   Psychiatric/Behavioral: Negative.         Objective:  Physical Exam  Constitutional: She is oriented to person, place, and time. Vital signs are  normal. She appears well-developed and well-nourished. She is active and cooperative. No distress.  BP 128/80   Pulse 83   Temp 98.2 F (36.8 C) (Oral)   Resp 18   Ht '5\' 4"'$  (1.626 m)   Wt 178 lb (80.7 kg)   SpO2 100%   BMI 30.55 kg/m    HENT:  Head: Normocephalic and atraumatic.  Right Ear: Hearing, tympanic membrane, external ear and ear canal normal. No foreign bodies.  Left Ear: Hearing, tympanic membrane, external ear and ear canal normal. No foreign bodies.  Nose: Nose normal.  Mouth/Throat: Uvula is midline, oropharynx is clear and moist and mucous membranes are normal. No oral lesions. Normal dentition. No dental abscesses or uvula swelling. No oropharyngeal exudate.  Eyes: Conjunctivae, EOM and lids are normal. Pupils are equal, round, and reactive to light. Right eye exhibits no discharge. Left eye exhibits no discharge. No scleral icterus.  Fundoscopic exam:      The right eye shows no arteriolar narrowing, no AV nicking, no exudate, no hemorrhage and no papilledema. The right eye shows red reflex.       The left eye shows no arteriolar narrowing, no AV nicking, no exudate, no hemorrhage and no papilledema. The left eye shows red reflex.  Neck: Trachea normal, normal range of motion and full passive range of motion without pain. Neck supple. No spinous process tenderness and no muscular tenderness present. No thyroid mass and no thyromegaly present.  Cardiovascular: Normal rate, regular rhythm, normal heart sounds, intact distal pulses and normal pulses.   Pulmonary/Chest: Effort normal and breath sounds normal. Right breast exhibits no inverted nipple, no mass, no nipple discharge, no skin change and no tenderness. Left breast exhibits no inverted nipple, no mass, no nipple discharge, no skin change and no tenderness. Breasts are symmetrical.  Abdominal: Soft. Bowel sounds are normal.  Musculoskeletal: She exhibits no edema or tenderness.       Cervical back: Normal.        Thoracic back: Normal.       Lumbar back: Normal.  Lymphadenopathy:       Head (right side): No tonsillar, no preauricular, no posterior auricular and no occipital adenopathy present.       Head (left side): No tonsillar, no preauricular, no posterior auricular and no occipital adenopathy present.    She has no cervical adenopathy.       Right: No supraclavicular adenopathy present.       Left: No supraclavicular adenopathy present.  Neurological: She is alert and oriented to person, place, and time. She has normal strength and normal reflexes. No cranial nerve deficit. She exhibits normal muscle tone. Coordination and gait normal.  Skin: Skin is warm, dry and intact. No rash noted. She is not diaphoretic. No cyanosis or erythema. Nails show no clubbing.  Psychiatric: She has a normal mood and affect. Her speech is normal and behavior is normal. Judgment and thought content  normal.   Results for orders placed or performed in visit on 02/09/16  Comprehensive metabolic panel  Result Value Ref Range   Sodium 138 135 - 146 mmol/L   Potassium 4.2 3.5 - 5.3 mmol/L   Chloride 101 98 - 110 mmol/L   CO2 28 20 - 31 mmol/L   Glucose, Bld 126 (H) 65 - 99 mg/dL   BUN 13 7 - 25 mg/dL   Creat 0.79 (H) 8.17 - 0.99 mg/dL   Total Bilirubin 0.8 0.2 - 1.2 mg/dL   Alkaline Phosphatase 60 33 - 130 U/L   AST 21 10 - 35 U/L   ALT 17 6 - 29 U/L   Total Protein 7.3 6.1 - 8.1 g/dL   Albumin 4.1 3.6 - 5.1 g/dL   Calcium 9.6 8.6 - 47.8 mg/dL  CBC with Differential/Platelet  Result Value Ref Range   WBC 5.0 3.8 - 10.8 K/uL   RBC 4.96 3.80 - 5.10 MIL/uL   Hemoglobin 14.4 11.7 - 15.5 g/dL   HCT 68.9 76.9 - 30.0 %   MCV 87.9 80.0 - 100.0 fL   MCH 29.0 27.0 - 33.0 pg   MCHC 33.0 32.0 - 36.0 g/dL   RDW 88.9 01.3 - 85.5 %   Platelets 229 140 - 400 K/uL   MPV 12.1 7.5 - 12.5 fL   Neutro Abs 1,800 1,500 - 7,800 cells/uL   Lymphs Abs 2,750 850 - 3,900 cells/uL   Monocytes Absolute 350 200 - 950 cells/uL    Eosinophils Absolute 100 15 - 500 cells/uL   Basophils Absolute 0 0 - 200 cells/uL   Neutrophils Relative % 36 %   Lymphocytes Relative 55 %   Monocytes Relative 7 %   Eosinophils Relative 2 %   Basophils Relative 0 %   Smear Review Criteria for review not met   HIV antibody  Result Value Ref Range   HIV 1&2 Ab, 4th Generation NONREACTIVE NONREACTIVE  TSH  Result Value Ref Range   TSH 1.44 mIU/L  Lipid panel  Result Value Ref Range   Cholesterol 285 (H) <200 mg/dL   Triglycerides 72 <289 mg/dL   HDL 88 >42 mg/dL   Total CHOL/HDL Ratio 3.2 <5.0 Ratio   VLDL 14 <30 mg/dL   LDL Cholesterol 406 (H) <100 mg/dL  POCT glycosylated hemoglobin (Hb A1C)  Result Value Ref Range   Hemoglobin A1C 6.8   POCT urinalysis dipstick  Result Value Ref Range   Color, UA yellow yellow   Clarity, UA clear clear   Glucose, UA negative negative   Bilirubin, UA negative negative   Ketones, POC UA negative negative   Spec Grav, UA 1.010    Blood, UA trace-intact (A) negative   pH, UA 5.0    Protein Ur, POC negative negative   Urobilinogen, UA 0.2    Nitrite, UA Negative Negative   Leukocytes, UA Negative Negative           Assessment & Plan:  1. Annual physical exam Age appropriate anticipatory guidance provided.  2. Screening for HIV (human immunodeficiency virus) - HIV antibody  3. Need for influenza vaccination - Flu Vaccine QUAD 36+ mos IM  4. Need for pneumococcal vaccination Booster Pneumovax in 5 years. Prevanr-13 at age 31. - Pneumococcal polysaccharide vaccine 23-valent greater than or equal to 2yo subcutaneous/IM  5. Essential hypertension Controlled. Continue current regimen. - Comprehensive metabolic panel - CBC with Differential/Platelet - TSH - POCT urinalysis dipstick - Urine Microscopic - valsartan (DIOVAN) 320 MG  tablet; Take 1 tablet (320 mg total) by mouth daily.  Dispense: 90 tablet; Refill: 3 - hydrochlorothiazide (HYDRODIURIL) 12.5 MG tablet; Take 1  tablet (12.5 mg total) by mouth daily.  Dispense: 90 tablet; Refill: 3  6. Elevated LDL cholesterol level Previous arthralgias with statins. Tolerated low dose Crestor, will try again. - Lipid panel - rosuvastatin (CRESTOR) 5 MG tablet; Take 1 tablet (5 mg total) by mouth daily.  Dispense: 90 tablet; Refill: 3  7. Elevated hemoglobin A1c measurement A1C is 6.8. Glucose is mildly elevated. This is diabetes. Will update problem list to reflect that. Advise daily ASA, continue ARB, start statin. - POCT glycosylated hemoglobin (Hb A1C) - HM Diabetes Eye Exam - HM Diabetes Foot Exam  8. BMI 30.0-30.9,adult Healthy eating and regular exercise.  9. Seasonal allergic rhinitis, unspecified chronicity, unspecified trigger Controlled. - montelukast (SINGULAIR) 10 MG tablet; Take 1 tablet (10 mg total) by mouth at bedtime.  Dispense: 30 tablet; Refill: 3 - azelastine (ASTELIN) 0.1 % nasal spray; Place 2 sprays into both nostrils 2 (two) times daily. Use in each nostril as directed  Dispense: 30 mL; Refill: 12  10. Hot flashes Controlled. - estradiol (VIVELLE-DOT) 0.05 MG/24HR patch; Place 1 patch (0.05 mg total) onto the skin 2 (two) times a week.  Dispense: 8 patch; Refill: 0  11. Anxiety state Lorazepam works better than clonazepam. Would like to switch back. - LORazepam (ATIVAN) 0.5 MG tablet; Take 1-2 tablets (0.5-1 mg total) by mouth 2 (two) times daily as needed for anxiety.  Dispense: 60 tablet; Refill: 0   Return in about 4 months (around 06/08/2016).    Fara Chute, PA-C Physician Assistant-Certified [facility]

## 2016-02-10 LAB — URINALYSIS, MICROSCOPIC ONLY
Bacteria, UA: NONE SEEN [HPF]
Casts: NONE SEEN [LPF]
Crystals: NONE SEEN [HPF]
RBC / HPF: NONE SEEN RBC/HPF (ref <=2)
WBC, UA: NONE SEEN WBC/HPF (ref <=5)
YEAST: NONE SEEN [HPF]

## 2016-02-11 ENCOUNTER — Encounter: Payer: Self-pay | Admitting: Physician Assistant

## 2016-05-26 ENCOUNTER — Other Ambulatory Visit: Payer: Self-pay | Admitting: Physician Assistant

## 2016-05-26 DIAGNOSIS — F411 Generalized anxiety disorder: Secondary | ICD-10-CM

## 2016-05-26 NOTE — Telephone Encounter (Signed)
311/21/2017 last refill

## 2016-05-30 NOTE — Telephone Encounter (Signed)
Done

## 2016-05-30 NOTE — Telephone Encounter (Signed)
[facility] 

## 2016-06-14 ENCOUNTER — Encounter: Payer: Self-pay | Admitting: Physician Assistant

## 2016-06-14 ENCOUNTER — Ambulatory Visit (INDEPENDENT_AMBULATORY_CARE_PROVIDER_SITE_OTHER): Payer: BLUE CROSS/BLUE SHIELD | Admitting: Physician Assistant

## 2016-06-14 VITALS — BP 138/78 | HR 72 | Temp 97.9°F | Ht 64.0 in | Wt 176.2 lb

## 2016-06-14 DIAGNOSIS — E7800 Pure hypercholesterolemia, unspecified: Secondary | ICD-10-CM | POA: Diagnosis not present

## 2016-06-14 DIAGNOSIS — I10 Essential (primary) hypertension: Secondary | ICD-10-CM

## 2016-06-14 DIAGNOSIS — E119 Type 2 diabetes mellitus without complications: Secondary | ICD-10-CM

## 2016-06-14 DIAGNOSIS — F430 Acute stress reaction: Secondary | ICD-10-CM

## 2016-06-14 DIAGNOSIS — E559 Vitamin D deficiency, unspecified: Secondary | ICD-10-CM

## 2016-06-14 MED ORDER — CLONAZEPAM 0.5 MG PO TABS: 0.5000 mg | ORAL_TABLET | Freq: Two times a day (BID) | ORAL | 0 refills | Status: DC | PRN

## 2016-06-14 MED ORDER — LOSARTAN POTASSIUM-HCTZ 100-12.5 MG PO TABS: 1.0000 | ORAL_TABLET | Freq: Every day | ORAL | 3 refills | Status: DC

## 2016-06-14 NOTE — Progress Notes (Signed)
Patient ID: Cynthia MasonClementine Stewart, female    DOB: 04-28-1950, 66 y.o.   MRN: [id number]  PCP: Porfirio Oarhelle Tzippy Testerman, PA-C  Chief Complaint  Patient presents with  . Follow-up    4 mth f/u    Subjective:   Presents for evaluation of diabetes, HTN, lipids.  She is doing well overall and tolerating medications without adverse effects.  Recent URI-type illness. Required antibiotic and steroid pack. Improving, but not yet back to her normal. Outside medication reconciliation reveals previous Vitamin D 50,000 IU.  Health fair at work recently, BP was elevated (170's/90). She was not feeling well, and wonders if that may have caused the elevation. Recent insurance change has increased the cost of the Diovan she's taken for years. Also on HCTZ, but notes that she's retaining fluid (notes pitting edema). Minimal salt intake. BP at home 130/80, previous normal was 120/70. Notes a lot of stress at work, and doesn't sleep much (3-4 hours/night), x 1-2 years. Has engaged HR. "The next time, I will call the police." Her assistant manager is being attacked in the same way, though she was brought in to assess the patient's complaints. "Either she goes or I will go." May nap on Saturdays. Exhausted. Doesn't have energy.  Review of Systems  Constitutional: Positive for fatigue. Negative for activity change, appetite change, chills, diaphoresis, fever and unexpected weight change.  HENT: Negative for congestion, rhinorrhea, sinus pain, sinus pressure and sore throat.   Eyes: Negative for photophobia and visual disturbance.  Respiratory: Negative for cough, chest tightness, shortness of breath and wheezing.   Cardiovascular: Negative for chest pain, palpitations and leg swelling.  Gastrointestinal: Negative for abdominal distention, abdominal pain, constipation, diarrhea, nausea and vomiting.  Endocrine: Negative for cold intolerance, heat intolerance, polydipsia, polyphagia and polyuria.  Genitourinary:  Negative for dysuria, frequency, hematuria and urgency.  Musculoskeletal: Negative for arthralgias, back pain, myalgias and neck stiffness.  Allergic/Immunologic: Positive for environmental allergies. Negative for food allergies and immunocompromised state.  Neurological: Negative for dizziness, weakness, light-headedness, numbness and headaches.  Hematological: Negative for adenopathy. Does not bruise/bleed easily.  Psychiatric/Behavioral: Positive for sleep disturbance. Negative for agitation, behavioral problems, confusion, decreased concentration, dysphoric mood, hallucinations, self-injury and suicidal ideas. The patient is not nervous/anxious and is not hyperactive.        Patient Active Problem List   Diagnosis Date Noted  . Vitamin D deficiency 06/14/2016  . BMI 30.0-30.9,adult 09/10/2015  . Diabetes mellitus type 2, controlled, without complications (HCC) 02/07/2014  . Elevated LDL cholesterol level 02/07/2014  . Environmental and seasonal allergies 02/07/2014  . Essential hypertension 01/15/2014  . DD (diverticular disease) 12/27/2011  . Hot flash, menopausal 11/23/2010  . Acute stress disorder 11/23/2010     Prior to Admission medications   Medication Sig Start Date End Date Taking? Authorizing Provider  azelastine (ASTELIN) 0.1 % nasal spray Place 2 sprays into both nostrils 2 (two) times daily. Use in each nostril as directed 02/09/16   Yehudis Monceaux, PA-C  clonazePAM (KLONOPIN) 0.5 MG tablet Take 1 tablet (0.5 mg total) by mouth 2 (two) times daily. Do not take ativan with this. 06/10/15   Elvina SidleKurt Lauenstein, MD  estradiol (VIVELLE-DOT) 0.05 MG/24HR patch Place 1 patch (0.05 mg total) onto the skin 2 (two) times a week. 02/11/16   Kaytelyn Glore, PA-C  hydrochlorothiazide (HYDRODIURIL) 12.5 MG tablet Take 1 tablet (12.5 mg total) by mouth daily. 02/09/16   Ileanna Gemmill, PA-C  LORazepam (ATIVAN) 0.5 MG tablet TAKE 1 TO 2 TABLETS  BY MOUTH 2 TIMES A DAY AS NEEDED FOR ANXIETY  05/30/16   Morrell Riddle, PA-C  montelukast (SINGULAIR) 10 MG tablet Take 1 tablet (10 mg total) by mouth at bedtime. 02/09/16   Topher Buenaventura, PA-C  rosuvastatin (CRESTOR) 5 MG tablet Take 1 tablet (5 mg total) by mouth daily. 02/09/16   Daris Harkins, PA-C  valsartan (DIOVAN) 320 MG tablet Take 1 tablet (320 mg total) by mouth daily. 02/09/16   Porfirio Oar, PA-C     Allergies  Allergen Reactions  . Codeine Other (See Comments)    HALLUCINATION  . Percocet [Oxycodone-Acetaminophen] Itching and Other (See Comments)    HALLUCINATION  . Trileptal [Oxcarbazepine]        Objective:  Physical Exam  Constitutional: She is oriented to person, place, and time. She appears well-developed and well-nourished. She is active and cooperative. No distress.  BP 138/78 (BP Location: Right Arm, Patient Position: Sitting, Cuff Size: Small)   Pulse 72   Temp 97.9 F (36.6 C) (Oral)   Ht 5\' 4"  (1.626 m)   Wt 176 lb 3.2 oz (79.9 kg)   SpO2 98%   BMI 30.24 kg/m   HENT:  Head: Normocephalic and atraumatic.  Right Ear: Hearing normal.  Left Ear: Hearing normal.  Eyes: Conjunctivae are normal. No scleral icterus.  Neck: Normal range of motion. Neck supple. No thyromegaly present.  Cardiovascular: Normal rate, regular rhythm and normal heart sounds.   Pulses:      Radial pulses are 2+ on the right side, and 2+ on the left side.  Pulmonary/Chest: Effort normal and breath sounds normal.  Lymphadenopathy:       Head (right side): No tonsillar, no preauricular, no posterior auricular and no occipital adenopathy present.       Head (left side): No tonsillar, no preauricular, no posterior auricular and no occipital adenopathy present.    She has no cervical adenopathy.       Right: No supraclavicular adenopathy present.       Left: No supraclavicular adenopathy present.  Neurological: She is alert and oriented to person, place, and time. No sensory deficit.  Skin: Skin is warm, dry and intact. No  rash noted. No cyanosis or erythema. Nails show no clubbing.  Psychiatric: She has a normal mood and affect. Her speech is normal and behavior is normal.           Assessment & Plan:   Problem List Items Addressed This Visit    Essential hypertension - Primary    D/C valsartan and HCTZ (due to increased cost of valsartan). Start losartanHCTZ 100-12.5. Monitor BP at home, and let me know if consistently >140/90.      Relevant Medications   losartan-hydrochlorothiazide (HYZAAR) 100-12.5 MG tablet   Other Relevant Orders   CBC with Differential/Platelet   Comprehensive metabolic panel   Diabetes mellitus type 2, controlled, without complications (HCC)    Asymptomatic. Tolerating ARB and statin. Glucose is diet controlled. Await lab results. Start metformin if A1C >7.0%.      Relevant Medications   losartan-hydrochlorothiazide (HYZAAR) 100-12.5 MG tablet   Other Relevant Orders   Comprehensive metabolic panel   Hemoglobin A1c   Microalbumin, urine   Elevated LDL cholesterol level    Continue Crestor-tolerating it well. Await lab results and increase dose if needed.      Relevant Orders   Lipid panel   Acute stress disorder    Not interested in daily treatment. Has a plan for safety at  work. Encouraged use of clonazepam at [employer] in order to improve her sleep.      Relevant Medications   clonazePAM (KLONOPIN) 0.5 MG tablet   Vitamin D deficiency    Update vitamin D level. Adjust supplement dose if needed.      Relevant Orders   VITAMIN D 25 Hydroxy (Vit-D Deficiency, Fractures)

## 2016-06-14 NOTE — Assessment & Plan Note (Signed)
Update vitamin D level. Adjust supplement dose if needed.

## 2016-06-14 NOTE — Assessment & Plan Note (Signed)
Not interested in daily treatment. Has a plan for safety at work. Encouraged use of clonazepam at [facility] in order to improve her sleep.

## 2016-06-14 NOTE — Patient Instructions (Addendum)
Try the clonazepam at bedtime, to see if it will help you sleep.  Let me know if your blood pressure is not consistently below 140/90.    IF you received an x-ray today, you will receive an invoice from [facility]. Please contact [facility] at [phone] with questions or concerns regarding your invoice.   IF you received labwork today, you will receive an invoice from [facility]. Please contact [facility] at [phone] with questions or concerns regarding your invoice.   Our billing staff will not be able to assist you with questions regarding bills from these companies.  You will be contacted with the lab results as soon as they are available. The fastest way to get your results is to activate your My Chart account. Instructions are located on the last page of this paperwork. If you have not heard from us regarding the results in 2 weeks, please contact this office.

## 2016-06-14 NOTE — Assessment & Plan Note (Signed)
D/C valsartan and HCTZ (due to increased cost of valsartan). Start losartanHCTZ 100-12.5. Monitor BP at home, and let me know if consistently >140/90.

## 2016-06-14 NOTE — Assessment & Plan Note (Signed)
Continue Crestor-tolerating it well. Await lab results and increase dose if needed.

## 2016-06-14 NOTE — Assessment & Plan Note (Addendum)
Asymptomatic. Tolerating ARB and statin. Glucose is diet controlled. Await lab results. Start metformin if A1C >7.0%.

## 2016-06-15 LAB — CBC WITH DIFFERENTIAL/PLATELET
Basophils Absolute: 0 10*3/uL (ref 0.0–0.2)
Basos: 0 %
EOS (ABSOLUTE): 0.1 10*3/uL (ref 0.0–0.4)
Eos: 2 %
Hematocrit: 40.1 % (ref 34.0–46.6)
Hemoglobin: 13.6 g/dL (ref 11.1–15.9)
IMMATURE GRANULOCYTES: 0 %
Immature Grans (Abs): 0 10*3/uL (ref 0.0–0.1)
Lymphocytes Absolute: 2.8 10*3/uL (ref 0.7–3.1)
Lymphs: 51 %
MCH: 29.7 pg (ref 26.6–33.0)
MCHC: 33.9 g/dL (ref 31.5–35.7)
MCV: 88 fL (ref 79–97)
MONOS ABS: 0.5 10*3/uL (ref 0.1–0.9)
Monocytes: 10 %
NEUTROS ABS: 2.1 10*3/uL (ref 1.4–7.0)
NEUTROS PCT: 37 %
PLATELETS: 227 10*3/uL (ref 150–379)
RBC: 4.58 x10E6/uL (ref 3.77–5.28)
RDW: 15 % (ref 12.3–15.4)
WBC: 5.5 10*3/uL (ref 3.4–10.8)

## 2016-06-15 LAB — COMPREHENSIVE METABOLIC PANEL
A/G RATIO: 1.4 (ref 1.2–2.2)
ALT: 15 IU/L (ref 0–32)
AST: 19 IU/L (ref 0–40)
Albumin: 4 g/dL (ref 3.6–4.8)
Alkaline Phosphatase: 65 IU/L (ref 39–117)
BILIRUBIN TOTAL: 0.4 mg/dL (ref 0.0–1.2)
BUN / CREAT RATIO: 16 (ref 12–28)
BUN: 15 mg/dL (ref 8–27)
CHLORIDE: 102 mmol/L (ref 96–106)
CO2: 26 mmol/L (ref 18–29)
Calcium: 9.4 mg/dL (ref 8.7–10.3)
Creatinine, Ser: 0.96 mg/dL (ref 0.57–1.00)
GFR, EST AFRICAN AMERICAN: 73 mL/min/{1.73_m2} (ref >59)
GFR, EST NON AFRICAN AMERICAN: 63 mL/min/{1.73_m2} (ref >59)
Globulin, Total: 2.9 g/dL (ref 1.5–4.5)
Glucose: 123 mg/dL — ABNORMAL HIGH (ref 65–99)
POTASSIUM: 4.4 mmol/L (ref 3.5–5.2)
Sodium: 143 mmol/L (ref 134–144)
Total Protein: 6.9 g/dL (ref 6.0–8.5)

## 2016-06-15 LAB — LIPID PANEL
CHOLESTEROL TOTAL: 271 mg/dL — AB (ref 100–199)
Chol/HDL Ratio: 3.5 ratio units (ref 0.0–4.4)
HDL: 78 mg/dL (ref >39)
LDL CALC: 177 mg/dL — AB (ref 0–99)
Triglycerides: 80 mg/dL (ref 0–149)
VLDL Cholesterol Cal: 16 mg/dL (ref 5–40)

## 2016-06-15 LAB — HEMOGLOBIN A1C
Est. average glucose Bld gHb Est-mCnc: 151 mg/dL
Hgb A1c MFr Bld: 6.9 % — ABNORMAL HIGH (ref 4.8–5.6)

## 2016-06-15 LAB — VITAMIN D 25 HYDROXY (VIT D DEFICIENCY, FRACTURES): Vit D, 25-Hydroxy: 15.4 ng/mL — ABNORMAL LOW (ref 30.0–100.0)

## 2016-06-23 ENCOUNTER — Telehealth: Payer: Self-pay | Admitting: Physician Assistant

## 2016-06-23 NOTE — Telephone Encounter (Signed)
Pt is requesting a Dr. note from [facility] 06/14/16  Pt contact phone number [phone]

## 2016-06-23 NOTE — Telephone Encounter (Signed)
Note written and up front, pt advised

## 2016-06-27 ENCOUNTER — Other Ambulatory Visit: Payer: Self-pay | Admitting: Physician Assistant

## 2016-06-27 DIAGNOSIS — R232 Flushing: Secondary | ICD-10-CM

## 2016-07-26 ENCOUNTER — Encounter: Payer: Self-pay | Admitting: Family Medicine

## 2016-07-26 ENCOUNTER — Encounter (HOSPITAL_COMMUNITY): Payer: Self-pay

## 2016-07-26 ENCOUNTER — Ambulatory Visit (INDEPENDENT_AMBULATORY_CARE_PROVIDER_SITE_OTHER): Payer: BLUE CROSS/BLUE SHIELD | Admitting: Family Medicine

## 2016-07-26 ENCOUNTER — Observation Stay (HOSPITAL_COMMUNITY)
Admission: EM | Admit: 2016-07-26 | Discharge: 2016-07-29 | Disposition: A | Discharge status: Home / Self Care | Payer: BLUE CROSS/BLUE SHIELD | Attending: Internal Medicine | Admitting: Internal Medicine

## 2016-07-26 ENCOUNTER — Emergency Department (HOSPITAL_COMMUNITY): Payer: BLUE CROSS/BLUE SHIELD

## 2016-07-26 VITALS — BP 146/90 | HR 121 | Temp 98.6°F | Resp 16 | Ht 64.0 in | Wt 175.4 lb

## 2016-07-26 DIAGNOSIS — R0789 Other chest pain: Secondary | ICD-10-CM | POA: Diagnosis not present

## 2016-07-26 DIAGNOSIS — E119 Type 2 diabetes mellitus without complications: Secondary | ICD-10-CM

## 2016-07-26 DIAGNOSIS — E785 Hyperlipidemia, unspecified: Secondary | ICD-10-CM | POA: Diagnosis not present

## 2016-07-26 DIAGNOSIS — R079 Chest pain, unspecified: Secondary | ICD-10-CM | POA: Diagnosis present

## 2016-07-26 DIAGNOSIS — Z79818 Long term (current) use of other agents affecting estrogen receptors and estrogen levels: Secondary | ICD-10-CM

## 2016-07-26 DIAGNOSIS — Z8249 Family history of ischemic heart disease and other diseases of the circulatory system: Secondary | ICD-10-CM | POA: Diagnosis not present

## 2016-07-26 DIAGNOSIS — I10 Essential (primary) hypertension: Secondary | ICD-10-CM

## 2016-07-26 DIAGNOSIS — Z86018 Personal history of other benign neoplasm: Secondary | ICD-10-CM

## 2016-07-26 DIAGNOSIS — Z885 Allergy status to narcotic agent status: Secondary | ICD-10-CM

## 2016-07-26 DIAGNOSIS — J302 Other seasonal allergic rhinitis: Secondary | ICD-10-CM | POA: Insufficient documentation

## 2016-07-26 DIAGNOSIS — E7800 Pure hypercholesterolemia, unspecified: Secondary | ICD-10-CM | POA: Diagnosis not present

## 2016-07-26 DIAGNOSIS — Z79899 Other long term (current) drug therapy: Secondary | ICD-10-CM | POA: Diagnosis not present

## 2016-07-26 DIAGNOSIS — Z888 Allergy status to other drugs, medicaments and biological substances status: Secondary | ICD-10-CM

## 2016-07-26 DIAGNOSIS — R200 Anesthesia of skin: Secondary | ICD-10-CM | POA: Diagnosis not present

## 2016-07-26 DIAGNOSIS — Z8349 Family history of other endocrine, nutritional and metabolic diseases: Secondary | ICD-10-CM

## 2016-07-26 DIAGNOSIS — E559 Vitamin D deficiency, unspecified: Secondary | ICD-10-CM | POA: Diagnosis not present

## 2016-07-26 DIAGNOSIS — Z87891 Personal history of nicotine dependence: Secondary | ICD-10-CM | POA: Diagnosis not present

## 2016-07-26 DIAGNOSIS — I480 Paroxysmal atrial fibrillation: Secondary | ICD-10-CM

## 2016-07-26 DIAGNOSIS — Z79890 Hormone replacement therapy (postmenopausal): Secondary | ICD-10-CM | POA: Insufficient documentation

## 2016-07-26 DIAGNOSIS — N951 Menopausal and female climacteric states: Secondary | ICD-10-CM | POA: Diagnosis present

## 2016-07-26 DIAGNOSIS — Z823 Family history of stroke: Secondary | ICD-10-CM

## 2016-07-26 DIAGNOSIS — Z833 Family history of diabetes mellitus: Secondary | ICD-10-CM

## 2016-07-26 DIAGNOSIS — F419 Anxiety disorder, unspecified: Secondary | ICD-10-CM | POA: Diagnosis not present

## 2016-07-26 DIAGNOSIS — R9439 Abnormal result of other cardiovascular function study: Secondary | ICD-10-CM | POA: Diagnosis not present

## 2016-07-26 DIAGNOSIS — Z90710 Acquired absence of both cervix and uterus: Secondary | ICD-10-CM

## 2016-07-26 DIAGNOSIS — R000 Tachycardia, unspecified: Secondary | ICD-10-CM | POA: Diagnosis not present

## 2016-07-26 DIAGNOSIS — Z6830 Body mass index (BMI) 30.0-30.9, adult: Secondary | ICD-10-CM

## 2016-07-26 DIAGNOSIS — J3089 Other allergic rhinitis: Secondary | ICD-10-CM | POA: Diagnosis present

## 2016-07-26 DIAGNOSIS — Z8261 Family history of arthritis: Secondary | ICD-10-CM

## 2016-07-26 LAB — BASIC METABOLIC PANEL
Anion gap: 9 (ref 5–15)
BUN: 11 mg/dL (ref 6–20)
CALCIUM: 9 mg/dL (ref 8.9–10.3)
CO2: 25 mmol/L (ref 22–32)
CREATININE: 0.91 mg/dL (ref 0.44–1.00)
Chloride: 105 mmol/L (ref 101–111)
GFR calc Af Amer: >60 mL/min (ref >60)
GFR calc non Af Amer: >60 mL/min (ref >60)
GLUCOSE: 112 mg/dL — AB (ref 65–99)
Potassium: 3.6 mmol/L (ref 3.5–5.1)
Sodium: 139 mmol/L (ref 135–145)

## 2016-07-26 LAB — I-STAT TROPONIN, ED: Troponin i, poc: 0 ng/mL (ref 0.00–0.08)

## 2016-07-26 LAB — POCT CBC
GRANULOCYTE PERCENT: 52.4 % (ref 37–80)
HCT, POC: 40.6 % (ref 37.7–47.9)
Hemoglobin: 14 g/dL (ref 12.2–16.2)
Lymph, poc: 2.8 (ref 0.6–3.4)
MCH, POC: 29.6 pg (ref 27–31.2)
MCHC: 34.5 g/dL (ref 31.8–35.4)
MCV: 85.7 fL (ref 80–97)
MID (cbc): 0.2 (ref 0–0.9)
MPV: 10 fL (ref 0–99.8)
PLATELET COUNT, POC: 209 10*3/uL (ref 142–424)
POC Granulocyte: 3.3 (ref 2–6.9)
POC LYMPH PERCENT: 44 %L (ref 10–50)
POC MID %: 3.6 %M (ref 0–12)
RBC: 4.74 M/uL (ref 4.04–5.48)
RDW, POC: 13.9 %
WBC: 6.3 10*3/uL (ref 4.6–10.2)

## 2016-07-26 LAB — CBC
HCT: 39.1 % (ref 36.0–46.0)
HEMOGLOBIN: 13.3 g/dL (ref 12.0–15.0)
MCH: 29 pg (ref 26.0–34.0)
MCHC: 34 g/dL (ref 30.0–36.0)
MCV: 85.2 fL (ref 78.0–100.0)
PLATELETS: 215 10*3/uL (ref 150–400)
RBC: 4.59 MIL/uL (ref 3.87–5.11)
RDW: 13.7 % (ref 11.5–15.5)
WBC: 6.3 10*3/uL (ref 4.0–10.5)

## 2016-07-26 LAB — D-DIMER, QUANTITATIVE: D-Dimer, Quant: 0.5 ug/mL-FEU (ref 0.00–0.50)

## 2016-07-26 LAB — TROPONIN I: Troponin I: <0.03 ng/mL (ref <0.03)

## 2016-07-26 LAB — GLUCOSE, POCT (MANUAL RESULT ENTRY): POC GLUCOSE: 207 mg/dL — AB (ref 70–99)

## 2016-07-26 MED ORDER — ONDANSETRON HCL 4 MG/2ML IJ SOLN: 4.0000 mg | Freq: Four times a day (QID) | INTRAMUSCULAR | Status: DC | PRN

## 2016-07-26 MED ORDER — LOSARTAN POTASSIUM 50 MG PO TABS
100.0000 mg | ORAL_TABLET | Freq: Every day | ORAL | Status: DC
  Administered 2016-07-27 – 2016-07-29 (×3): 100 mg via ORAL
  Filled 2016-07-26 (×3): qty 2

## 2016-07-26 MED ORDER — AZELASTINE HCL 0.1 % NA SOLN
2.0000 | Freq: Two times a day (BID) | NASAL | Status: DC | PRN
  Filled 2016-07-26: qty 30

## 2016-07-26 MED ORDER — LORATADINE 10 MG PO TABS: 10.0000 mg | ORAL_TABLET | Freq: Every day | ORAL | Status: DC | PRN

## 2016-07-26 MED ORDER — NITROGLYCERIN 0.4 MG SL SUBL
0.4000 mg | SUBLINGUAL_TABLET | SUBLINGUAL | Status: DC | PRN
  Administered 2016-07-26: 0.4 mg via SUBLINGUAL
  Filled 2016-07-26: qty 1

## 2016-07-26 MED ORDER — LOSARTAN POTASSIUM-HCTZ 100-12.5 MG PO TABS: 1.0000 | ORAL_TABLET | Freq: Every day | ORAL | Status: DC

## 2016-07-26 MED ORDER — MONTELUKAST SODIUM 10 MG PO TABS
10.0000 mg | ORAL_TABLET | Freq: Every day | ORAL | Status: DC
  Administered 2016-07-26 – 2016-07-28 (×3): 10 mg via ORAL
  Filled 2016-07-26 (×3): qty 1

## 2016-07-26 MED ORDER — ROSUVASTATIN CALCIUM 10 MG PO TABS
5.0000 mg | ORAL_TABLET | Freq: Every day | ORAL | Status: DC
  Administered 2016-07-27 – 2016-07-29 (×3): 5 mg via ORAL
  Filled 2016-07-26 (×3): qty 1

## 2016-07-26 MED ORDER — ENOXAPARIN SODIUM 40 MG/0.4ML ~~LOC~~ SOLN
40.0000 mg | SUBCUTANEOUS | Status: DC
  Administered 2016-07-26 – 2016-07-27 (×2): 40 mg via SUBCUTANEOUS
  Filled 2016-07-26 (×3): qty 0.4

## 2016-07-26 MED ORDER — ASPIRIN 325 MG PO TABS
325.0000 mg | ORAL_TABLET | Freq: Every day | ORAL | Status: AC
  Administered 2016-07-26: 325 mg via ORAL

## 2016-07-26 MED ORDER — HYDROCHLOROTHIAZIDE 12.5 MG PO CAPS
12.5000 mg | ORAL_CAPSULE | Freq: Every day | ORAL | Status: DC
  Administered 2016-07-27 – 2016-07-29 (×3): 12.5 mg via ORAL
  Filled 2016-07-26 (×3): qty 1

## 2016-07-26 MED ORDER — ACETAMINOPHEN 325 MG PO TABS
650.0000 mg | ORAL_TABLET | ORAL | Status: DC | PRN
  Administered 2016-07-28: 650 mg via ORAL
  Filled 2016-07-26: qty 2

## 2016-07-26 NOTE — ED Provider Notes (Signed)
[facility]-EMERGENCY DEPT Provider Note   CSN: [id number] Arrival date & time: 07/26/16  1450     History   Chief Complaint Chief Complaint  Patient presents with  . Chest Pain    HPI Cynthia Stewart is a 66 y.o. female w PMHX DM and HTN,  Who presents today with chief complaint substernal chest pressure and pain  For 4 days.  Pain is intermittent,  And does not seem to be related to activity.  Patient states that pain is more of a  substernal pressure in nature with associated tightness/ squeezing which developed 2 days ago.  She endorses increased shortness of breath due to the pain. She awoke this morning at around 4 AM due to the pressure  And states she notices it increases throughout her workday.  Today she states she felt her pulse racing but denies palpitations,  Radiation,  Diaphoresis,  PND, orthopnea, loss of consciousness, abd pain, n/v/d.  She endorses intermittent lightheadedness.  She went to her primary care for evaluation today and was found to be hypertensive and tachycardic and  Was instructed to go to the emergency department.  She was given 5 baby aspirin and primary care with some improvement of symptoms.  She has not tried anything else for her symptoms.    she states she has had symptoms like this in the past but they were always short lived and usually improved with rest within minutes.   She also endorses a frontal headache last night which persisted into this morning for which she took ibuprofen and  resolved.   Denies cardiac hx, last stress test 10 years ago normal, no recent travel or surgeries, takes estrogen patch, former smoker quit >30ya, no hx dvts.    The history is provided by the patient.    Past Medical History:  Diagnosis Date  . Allergy   . Hyperlipidemia   . Hypertension     Patient Active Problem List   Diagnosis Date Noted  . Chest pain 07/26/2016  . Vitamin D deficiency 06/14/2016  . BMI 30.0-30.9,adult 09/10/2015  . Diabetes mellitus type  2, controlled, without complications (HCC) 02/07/2014  . Elevated LDL cholesterol level 02/07/2014  . Environmental and seasonal allergies 02/07/2014  . Essential hypertension 01/15/2014  . DD (diverticular disease) 12/27/2011  . Hot flash, menopausal 11/23/2010  . Acute stress disorder 11/23/2010    Past Surgical History:  Procedure Laterality Date  . ABDOMINAL HYSTERECTOMY    . APPENDECTOMY    . BREAST LUMPECTOMY Bilateral   . CHOLECYSTECTOMY      OB History    No data available       Home Medications    Prior to Admission medications   Medication Sig Start Date End Date Taking? Authorizing Provider  azelastine (ASTELIN) 0.1 % nasal spray Place 2 sprays into both nostrils 2 (two) times daily. Use in each nostril as directed 02/09/16  Yes Jeffery, Chelle, PA-C  clonazePAM (KLONOPIN) 0.5 MG tablet Take 1 tablet (0.5 mg total) by mouth 2 (two) times daily as needed for anxiety. Patient taking differently: Take 0.5 mg by mouth daily as needed for anxiety.  06/14/16  Yes Jeffery, Chelle, PA-C  estradiol (VIVELLE-DOT) 0.05 MG/24HR patch APPLY 1 PATCH(0.05 MG) EXTERNALLY TO THE SKIN 2 TIMES A WEEK 06/28/16  Yes Jeffery, Chelle, PA-C  fexofenadine (ALLEGRA) 180 MG tablet Take 180 mg by mouth daily.   Yes [provider]  losartan-hydrochlorothiazide (HYZAAR) 100-12.5 MG tablet Take 1 tablet by mouth daily. 06/14/16  Yes Jeffery, Chelle, PA-C  montelukast (SINGULAIR) 10 MG tablet Take 1 tablet (10 mg total) by mouth at bedtime. 02/09/16  Yes Jeffery, Chelle, PA-C  rosuvastatin (CRESTOR) 5 MG tablet Take 1 tablet (5 mg total) by mouth daily. 02/09/16  Yes Porfirio Oar, PA-C    Family History Family History  Problem Relation Age of Onset  . Hypertension Mother   . Diabetes Mother   . Hyperlipidemia Mother   . CVA Mother   . Rheum arthritis Father   . Hyperlipidemia Sister   . Hypertension Sister   . Hypertension Brother     Social History Social History  Substance  Use Topics  . Smoking status: Former Games developer  . Smokeless tobacco: Never Used     Comment: as a teenager  . Alcohol use No     Allergies   Codeine; Percocet [oxycodone-acetaminophen]; and Trileptal [oxcarbazepine]   Review of Systems Review of Systems  Respiratory: Positive for chest tightness and shortness of breath.   Cardiovascular: Negative for palpitations and leg swelling.  Gastrointestinal: Negative for abdominal pain, blood in stool, diarrhea, nausea and vomiting.  Genitourinary: Negative for dysuria and hematuria.  Neurological: Positive for light-headedness and headaches. Negative for syncope.     Physical Exam Updated Vital Signs BP 134/74 (BP Location: Right Arm)   Pulse 81   Temp 98.9 F (37.2 C) (Oral)   Resp 14   Ht 5\' 4"  (1.626 m)   Wt 78 kg   SpO2 99%   BMI 29.51 kg/m   Physical Exam  Constitutional: She is oriented to person, place, and time. She appears well-developed and well-nourished. No distress.  HENT:  Head: Normocephalic and atraumatic.  Eyes: Conjunctivae are normal.  Neck: Neck supple. No JVD present. No tracheal deviation present.  Cardiovascular: Regular rhythm, normal heart sounds and intact distal pulses.   No murmur heard. Tachycardic, 2+ radial and DP/PT pulses bilaterally. Negative Homans bilaterally.   Pulmonary/Chest: Effort normal and breath sounds normal. No respiratory distress. She exhibits no tenderness.  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 There is no tenderness.  Musculoskeletal: Normal range of motion. She exhibits no edema.  Neurological: She is alert and oriented to person, place, and time. No cranial nerve deficit or sensory deficit.  Fluent speech, no facial droop, sensation intact globally  Skin: Skin is warm and dry.  Psychiatric: She has a normal mood and affect. Her behavior is normal.  Nursing note and vitals reviewed.    ED Treatments /  Results  Labs (all labs ordered are listed, but only abnormal results are displayed) Labs Reviewed  BASIC METABOLIC PANEL - Abnormal; Notable for the following:       Result Value   Glucose, Bld 112 (*)    All other components within normal limits  BASIC METABOLIC PANEL - Abnormal; Notable for the following:    Glucose, Bld 128 (*)    Creatinine, Ser 1.04 (*)    GFR calc non Af Amer 56 (*)    All other components within normal limits  CBC  D-DIMER, QUANTITATIVE (NOT AT [facility])  TROPONIN I  TROPONIN I  TROPONIN I  Rosezena Sensor, ED  I-STAT TROPOININ, ED    EKG  EKG Interpretation  Date/Time:  Tuesday Jul 26 2016 15:06:03 EDT Ventricular Rate:  97 PR Interval:    QRS Duration: 80 QT Interval:  361 QTC Calculation: 459 R Axis:   41 Text Interpretation:  Sinus rhythm No old tracing to compare Confirmed by [facility]  MD, MARTHA [phone]) on 07/26/2016 3:31:35 PM       Radiology Dg Chest 2 View  Result Date: 07/26/2016 CLINICAL DATA:  Shortness of breath and chest pain for several days EXAM: CHEST  2 VIEW COMPARISON:  None. FINDINGS: Changes consistent with prior median sternotomy are noted. Cardiac shadow is within normal limits. The lungs are clear. No other bony abnormality is seen. IMPRESSION: No active cardiopulmonary disease. Electronically Signed   By: Alcide Clever M.D.   On: 07/26/2016 16:19    Procedures Procedures (including critical care time)  Medications Ordered in ED Medications  nitroGLYCERIN (NITROSTAT) SL tablet 0.4 mg (0.4 mg Sublingual Given 07/26/16 1841)  montelukast (SINGULAIR) tablet 10 mg (10 mg Oral Given 07/26/16 2139)  rosuvastatin (CRESTOR) tablet 5 mg (5 mg Oral Given 07/27/16 0933)  azelastine (ASTELIN) 0.1 % nasal spray 2 spray (not administered)  loratadine (CLARITIN) tablet 10 mg (not administered)  acetaminophen (TYLENOL) tablet 650 mg (not administered)  ondansetron (ZOFRAN) injection 4 mg (not administered)  enoxaparin (LOVENOX) injection 40 mg  (40 mg Subcutaneous Given 07/26/16 2139)  losartan (COZAAR) tablet 100 mg (100 mg Oral Given 07/27/16 0932)    And  hydrochlorothiazide (MICROZIDE) capsule 12.5 mg (12.5 mg Oral Given 07/27/16 0932)  clonazePAM (KLONOPIN) tablet 0.5 mg (0.5 mg Oral Given 07/27/16 0148)  technetium tetrofosmin (TC-MYOVIEW) injection 10 millicurie (10 millicuries Intravenous Contrast Given 07/27/16 0954)  technetium tetrofosmin (TC-MYOVIEW) injection 30 millicurie (30 millicuries Intravenous Contrast Given 07/27/16 1329)     Initial Impression / Assessment and Plan / ED Course  I have reviewed the triage vital signs and the nursing notes.  Pertinent labs & imaging results that were available during my care of the patient were reviewed by me and considered in my medical decision making (see chart for details).     Patient with history of DM, HLD, HTN presents with chief complaint of persistent intermittent worsening chest pain and pressure. Afebrile, hypertensive while in ED and intermittently tachycardic. EKG shows sinus rhythm, delta troponin is negative, d-dimer negative, chest x-ray without evidence of acute cardio pulmonary disease. Low suspicion of DVT, PE, MI. However symptoms consistent with unstable angina; with comorbidities, worsening symptoms, and HEART score of 4, patient somewhat high risk for cardiac event. Dr. Karma Ganja consulted internal medicine, who evaluated patient, assumed care, and patient will be brought into hospital for overnight evaluation and observation.   Final Clinical Impressions(s) / ED Diagnoses   Final diagnoses:  Chest pain    New Prescriptions Current Discharge Medication List       Jeanie Sewer, PA-C 07/27/16 1453    Jerelyn Scott, MD 07/30/16 [phone]

## 2016-07-26 NOTE — ED Triage Notes (Signed)
PER EMS: pt here with c/o sudden onset of central chest pain and SOB, onset Saturday. Pt denies having any current "pain" but describes it as a "discomfort." Pt had 324 asa at urgent care today. BP-166/92, HR-16, HR-107 regular. A&OX4.

## 2016-07-26 NOTE — ED Notes (Signed)
Report attempted x 1

## 2016-07-26 NOTE — Progress Notes (Signed)
Peripheral IV started per Dr. Noberto RetortStalling's verbal order. Deliah BostonMichael Clark, MS, PA-C 1:44 PM, 07/26/2016

## 2016-07-26 NOTE — Progress Notes (Signed)
Chief Complaint  Patient presents with  . Chest Pain    Chest pressure x 2 days  . Extremity Weakness    left arm tingling     HPI Patient is a 66yo F with Diet Controlled Diabetes, Essential Hypertension, Hyperlipidemia who now has chest pressure, tachycardia, dizziness and numbness down her left arm. Pt reports that she has been having chest pressures for 3 days that feels like a brick on her chest.  She states that she started noticing the pressure on Saturday.  She noted numbness from the elbow down the dizziness.  She stayed in the bed resting.  She states that she noticed that the pain intensified.  She rested overnight.  Overnight she had some heaviness in her chest area.  She states that all day today she feels like the heaviness. She feels tightness of the chest   She has a history of diabetes and hyperlipidemia Lab Results  Component Value Date   HGBA1C 6.9 (H) 06/14/2016   She reports that she is on top of her diabetes and very compliant She took all her usual medications today and denies any recent anxiety and panic attacks. She ate lunch at noon which consisted of [employer]  Past Medical History:  Diagnosis Date  . Allergy   . Hyperlipidemia   . Hypertension     Current Outpatient Prescriptions  Medication Sig Dispense Refill  . azelastine (ASTELIN) 0.1 % nasal spray Place 2 sprays into both nostrils 2 (two) times daily. Use in each nostril as directed 30 mL 12  . clonazePAM (KLONOPIN) 0.5 MG tablet Take 1 tablet (0.5 mg total) by mouth 2 (two) times daily as needed for anxiety. 60 tablet 0  . estradiol (VIVELLE-DOT) 0.05 MG/24HR patch APPLY 1 PATCH(0.05 MG) EXTERNALLY TO THE SKIN 2 TIMES A WEEK 8 patch 0  . losartan-hydrochlorothiazide (HYZAAR) 100-12.5 MG tablet Take 1 tablet by mouth daily. 90 tablet 3  . montelukast (SINGULAIR) 10 MG tablet Take 1 tablet (10 mg total) by mouth at bedtime. 30 tablet 3  . rosuvastatin (CRESTOR) 5 MG tablet Take 1 tablet (5 mg  total) by mouth daily. 90 tablet 3   Current Facility-Administered Medications  Medication Dose Route Frequency Provider Last Rate Last Dose  . aspirin tablet 325 mg  325 mg Oral Daily Collie Siad A, MD   325 mg at 07/26/16 1337    Allergies:  Allergies  Allergen Reactions  . Codeine Other (See Comments)    HALLUCINATION  . Percocet [Oxycodone-Acetaminophen] Itching and Other (See Comments)    HALLUCINATION  . Trileptal [Oxcarbazepine]     Past Surgical History:  Procedure Laterality Date  . ABDOMINAL HYSTERECTOMY    . APPENDECTOMY    . BREAST LUMPECTOMY Bilateral   . CHOLECYSTECTOMY      Social History   Social History  . Marital status: Married    Spouse name: Fayrene Fearing  . Number of children: 3  . Years of education: High School   Occupational History  . 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 Smoking status: Former Games developer  . Smokeless tobacco: Never Used     Comment: as a teenager  . Alcohol use No  . Drug use: No  . Sexual activity: Yes    Partners: Male    Birth control/ protection: Surgical   Other Topics Concern  . Not on file   Social History Narrative   Lives with her husband.   Daughters live in [place], [place], [place]    Exercise: No.    Review of Systems  Constitutional: Negative for chills, fever, malaise/fatigue and weight loss.  HENT: Negative for ear discharge, ear pain and nosebleeds.   Eyes: Negative for blurred vision and double vision.  Respiratory: Positive for shortness of breath. Negative for cough and wheezing.   Cardiovascular: Positive for chest pain. Negative for palpitations and leg swelling.  Gastrointestinal: Negative for abdominal pain, heartburn, nausea and vomiting.  Genitourinary: Negative for dysuria and urgency.  Skin: Negative for itching and rash.  Neurological: Positive for dizziness. Negative for tingling, tremors, sensory change and headaches.  Psychiatric/Behavioral:  Negative for depression. The patient is not nervous/anxious.     Objective: Vitals:   07/26/16 1321  BP: (!) 146/90  Pulse: (!) 121  Resp: 16  Temp: 98.6 F (37 C)  TempSrc: Oral  SpO2: 96%  Weight: 175 lb 6.4 oz (79.6 kg)  Height: 5\' 4"  (1.626 m)    Physical Exam  Constitutional: She is oriented to person, place, and time. She appears well-developed and well-nourished.  HENT:  Head: Normocephalic and atraumatic.  Right Ear: External ear normal.  Left Ear: External ear normal.  Mouth/Throat: Oropharynx is clear and moist.  Eyes: Conjunctivae and EOM are normal.  Neck: Normal range of motion. Neck supple.  Cardiovascular: Regular rhythm and normal pulses.  Tachycardia present.   No murmur heard. No palpable thrill  Pulmonary/Chest: Effort normal and breath sounds normal. No respiratory distress. She has no wheezes.  Abdominal: Soft. Bowel sounds are normal. She exhibits no distension and no mass. There is no tenderness. There is no guarding.  Musculoskeletal: Normal range of motion. She exhibits no edema.  Neurological: She is alert and oriented to person, place, and time. She displays normal reflexes. No cranial nerve deficit or sensory deficit. She exhibits normal muscle tone. Coordination normal.  Skin: Skin is warm. Capillary refill takes less than 2 seconds. No rash noted.     ECG sinus tachycardia, poor R wave progression No t wave inversions  Assessment and Plan Deairra was seen today for chest pain and extremity weakness.  Diagnoses and all orders for this visit:  Chest pain, unspecified type- ECG today shows some no twi, st elevation but new tachycardia  Pt given aspirin, oxygen and had stat labs No nitrates given  Normal cbc -     EKG 12-Lead -     aspirin tablet 325 mg; Take 1 tablet (325 mg total) by mouth daily. -     POCT CBC -     POCT glucose (manual entry) -     Insert peripheral IV  Chest tightness or pressure- likely unstable angina, unable  to get stat labs  To further evaluate Will refer to ER by EMS  Left arm numbness- could be due to cardiac occlusive disease  Controlled type 2 diabetes mellitus without complication, without long-term current use of insulin (HCC)-  Current random glucose 207 but pt just ate Essential hypertension Elevated LDL cholesterol level  Reviewed risk factors for CAD    A total of 30 minutes were spent face-to-face with the patient during this encounter and over half of that time was spent on counseling and coordination of care.   Celie Desrochers A Kendre Jacinto

## 2016-07-26 NOTE — ED Notes (Signed)
Patient transported to X-ray 

## 2016-07-26 NOTE — H&P (Signed)
Date: 07/26/2016               Patient Name:  Cynthia Stewart MRN: [id number]  DOB: [birth year] Age / Sex: 66 y.o., female   PCP: Porfirio OarJeffery, Chelle, PA-C         Medical Service: Internal Medicine Teaching Service         Attending Physician: Dr. Burns SpainButcher, Elizabeth A, MD    First Contact: Dr. Nelson ChimesAmin Pager: [phone]  Second Contact: Dr. Loney Lohathore Pager: [phone]       After Hours (After 5p/  First Contact Pager: [phone]  weekends / holidays): Second Contact Pager: [phone]   Chief Complaint: chest pain   History of Present Illness:  66 yo woman with PMH hypertension, hld, diabetes mellitus, and anxiety presents from her PCPs office with chest heaviness which began 5/5. The chest heaviness was initially intermittent on Saturday but became worse Sunday and constant Monday. The chest heaviness wraps from her right chest to her left and is associated with difficulty breathing, palpitations, pain between her shoulders,  numbness in her left arm, dizziness and headache. She does not associate the chest heaviness with exertion or deep breathing. Ibuprofen worked to relieve theheadache and back pain, it did not work for the chest heaviness. The heaviness is relieved with rest and improved with nitro. She denies anxiety, stress, nausea, change in appetite, diaphoresis. She denies orthopnea or PND but has had lower extremity edema at the end of the day for years.   In the ED she was afebrile with HR 80s, BP 150/90s, SpO2 99% on room air. Labs revealed Na 139, K 3.6, bicarb 25, BUN 11, Crt 0.91, glucose 112, WBC 6.3, Hgb 13.3, plt 215, Trop 0.0, D-dimer 0.5, EKG showed NSR with no ST changes and chest xrary revealed no acute cardiopulmonary disease.   Meds:  Current Facility-Administered Medications for the 07/26/16 encounter [facility](Hospital Encounter)  Medication  . aspirin tablet 325 mg   Current Meds  Medication Sig  . azelastine (ASTELIN) 0.1 % nasal spray Place 2 sprays into both nostrils 2 (two) times  daily. Use in each nostril as directed  . clonazePAM (KLONOPIN) 0.5 MG tablet Take 1 tablet (0.5 mg total) by mouth 2 (two) times daily as needed for anxiety. (Patient taking differently: Take 0.5 mg by mouth daily as needed for anxiety. )  . estradiol (VIVELLE-DOT) 0.05 MG/24HR patch APPLY 1 PATCH(0.05 MG) EXTERNALLY TO THE SKIN 2 TIMES A WEEK  . fexofenadine (ALLEGRA) 180 MG tablet Take 180 mg by mouth daily.  Marland Kitchen. losartan-hydrochlorothiazide (HYZAAR) 100-12.5 MG tablet Take 1 tablet by mouth daily.  . montelukast (SINGULAIR) 10 MG tablet Take 1 tablet (10 mg total) by mouth at bedtime.  . rosuvastatin (CRESTOR) 5 MG tablet Take 1 tablet (5 mg total) by mouth daily.     Allergies: Allergies as of 07/26/2016 - Review Complete 07/26/2016  Allergen Reaction Noted  . Codeine Other (See Comments) 01/05/2013  . Percocet [oxycodone-acetaminophen] Itching and Other (See Comments) 01/05/2013  . Trileptal [oxcarbazepine]  02/10/2015   Past Medical History:  Diagnosis Date  . Allergy   . Hyperlipidemia   . Hypertension     Family History:  Family History  Problem Relation Age of Onset  . Hypertension Mother   . Diabetes Mother   . Hyperlipidemia Mother   . CVA Mother   . Rheum arthritis Father   . Hyperlipidemia Sister   . Hypertension Sister   . Hypertension Brother    Social History:  Denies tobacco, alcohol, or illicit drug use.   Review of Systems: A complete ROS was negative except as per HPI.   Physical Exam: Blood pressure (!) 148/96, pulse 76, temperature 98.2 F (36.8 C), resp. rate 12, height 5\' 4"  (1.626 m), weight 172 lb (78 kg), SpO2 100 %. Physical Exam  Constitutional: She is oriented to person, place, and time and well-developed, well-nourished, and in no distress. No distress.  HENT:  Head: Normocephalic and atraumatic.  Right Ear: External ear normal.  Left Ear: External ear normal.  Eyes: Right eye exhibits no discharge. Left eye exhibits no discharge. No  scleral icterus.  Cardiovascular: Normal rate, regular rhythm and intact distal pulses.   No murmur heard. No calf tenderness or swelling   Pulmonary/Chest: Effort normal and breath sounds normal. No respiratory distress. She has no wheezes. She has no rales. She exhibits no tenderness.  Abdominal: Soft. Bowel sounds are normal. She exhibits no distension. There is no tenderness. There is no guarding.  Neurological: She is alert and oriented to person, place, and time. No cranial nerve deficit.  Skin: Skin is warm and dry. She is not diaphoretic.  Psychiatric: Mood, affect and judgment normal.   EKG: sinus rhythm, 1mm ST elevation in V1  CXR: no acute cardiopulmonary disease, s/p sternal lipoma resection with sternotomy wires   Assessment & Plan by Problem: Principal Problem:   Chest pain Active Problems:   Essential hypertension   Diabetes mellitus type 2, controlled, without complications (HCC)   Environmental and seasonal allergies   Hot flash, menopausal   BMI 30.0-30.9,adult  Chest pain  Patient presents with 4 days of worsening chest pain associated with shortness of breath, upper back pain, and left arm numbness which has been worsening and improved with nitroglycerine. PMH of dm, hld, and htn contribute to a moderate risk heart score of 5 concerning for ACS. Although she had a D-dimer of 0.5 in the ED, the chest heaviness is not pleuritic, she has no new oxygen requirement, and is normocardic at this time so we will defer further workup for PE.  -continue rosuvastatin 5 mg daily  - continue troponin trend - consider cardiology consult tomorrow morning -NPO at midnight    estrogen replacement therapy  She is on estrogen replacement patch since hysterectomy over 10 years ago. She has a history of lumpectomy x2 and is not up to date on mammogram screening. States she is working with her PCP to taper off of estrogen replacement but has mood changes and hot sweats when she doesn't  have the patch. If this workup results in coronary artery disease she may need to have the patch discontinued soon after discharge.   Hypertension  -Continue home med losartan- HCTZ 100-12.5 mg daily   Hyperlipidemia  Continue home med rosuvastatin 5 mg daily   Type 2 Diabetes mellitus  Last A1c 05/2016 6.9, diet controlled.  - ordered carb modified diet  Anxiety  - home med Lorazepam PRN   Seasonal allergies  -continue home med loratadine and montelukast   Dispo: Admit patient to Observation with expected length of stay less than 2 midnights.  Signed: Eulah Pont, MD 07/26/2016, 8:52 PM  Pager: [phone]

## 2016-07-27 ENCOUNTER — Observation Stay (HOSPITAL_BASED_OUTPATIENT_CLINIC_OR_DEPARTMENT_OTHER): Payer: BLUE CROSS/BLUE SHIELD

## 2016-07-27 DIAGNOSIS — I10 Essential (primary) hypertension: Secondary | ICD-10-CM | POA: Diagnosis not present

## 2016-07-27 DIAGNOSIS — E785 Hyperlipidemia, unspecified: Secondary | ICD-10-CM | POA: Diagnosis not present

## 2016-07-27 DIAGNOSIS — R0789 Other chest pain: Secondary | ICD-10-CM | POA: Diagnosis not present

## 2016-07-27 DIAGNOSIS — R9439 Abnormal result of other cardiovascular function study: Secondary | ICD-10-CM | POA: Diagnosis not present

## 2016-07-27 DIAGNOSIS — R079 Chest pain, unspecified: Secondary | ICD-10-CM

## 2016-07-27 DIAGNOSIS — E119 Type 2 diabetes mellitus without complications: Secondary | ICD-10-CM | POA: Diagnosis not present

## 2016-07-27 DIAGNOSIS — I480 Paroxysmal atrial fibrillation: Secondary | ICD-10-CM

## 2016-07-27 DIAGNOSIS — F419 Anxiety disorder, unspecified: Secondary | ICD-10-CM | POA: Diagnosis not present

## 2016-07-27 LAB — BASIC METABOLIC PANEL
Anion gap: 9 (ref 5–15)
BUN: 13 mg/dL (ref 6–20)
CHLORIDE: 102 mmol/L (ref 101–111)
CO2: 26 mmol/L (ref 22–32)
CREATININE: 1.04 mg/dL — AB (ref 0.44–1.00)
Calcium: 9 mg/dL (ref 8.9–10.3)
GFR calc non Af Amer: 56 mL/min — ABNORMAL LOW (ref >60)
Glucose, Bld: 128 mg/dL — ABNORMAL HIGH (ref 65–99)
Potassium: 3.6 mmol/L (ref 3.5–5.1)
SODIUM: 137 mmol/L (ref 135–145)

## 2016-07-27 LAB — TROPONIN I
Troponin I: <0.03 ng/mL (ref <0.03)
Troponin I: <0.03 ng/mL (ref <0.03)

## 2016-07-27 LAB — NM MYOCAR MULTI W/SPECT W/WALL MOTION / EF
CHL CUP RESTING HR STRESS: 63 {beats}/min
CSEPED: 8 min
CSEPEW: 10.1 METS
Exercise duration (sec): 0 s
MPHR: 157 {beats}/min
Peak HR: 162 {beats}/min
Percent HR: 103 %

## 2016-07-27 MED ORDER — SODIUM CHLORIDE 0.9% FLUSH: 3.0000 mL | INTRAVENOUS | Status: DC | PRN

## 2016-07-27 MED ORDER — ASPIRIN 81 MG PO CHEW
81.0000 mg | CHEWABLE_TABLET | ORAL | Status: AC
  Administered 2016-07-28: 81 mg via ORAL
  Filled 2016-07-27: qty 1

## 2016-07-27 MED ORDER — SODIUM CHLORIDE 0.9 % IV SOLN: 250.0000 mL | INTRAVENOUS | Status: DC | PRN

## 2016-07-27 MED ORDER — SODIUM CHLORIDE 0.9 % WEIGHT BASED INFUSION
3.0000 mL/kg/h | INTRAVENOUS | Status: DC
  Administered 2016-07-28: 3 mL/kg/h via INTRAVENOUS

## 2016-07-27 MED ORDER — SODIUM CHLORIDE 0.9% FLUSH
3.0000 mL | Freq: Two times a day (BID) | INTRAVENOUS | Status: DC
  Administered 2016-07-27: 3 mL via INTRAVENOUS

## 2016-07-27 MED ORDER — CLONAZEPAM 0.5 MG PO TABS
0.5000 mg | ORAL_TABLET | Freq: Every day | ORAL | Status: DC | PRN
  Administered 2016-07-27 (×2): 0.5 mg via ORAL
  Filled 2016-07-27 (×2): qty 1

## 2016-07-27 MED ORDER — METOPROLOL TARTRATE 25 MG PO TABS: 25.0000 mg | ORAL_TABLET | Freq: Two times a day (BID) | ORAL | Status: DC

## 2016-07-27 MED ORDER — TECHNETIUM TC 99M TETROFOSMIN IV KIT
30.0000 | PACK | Freq: Once | INTRAVENOUS | Status: AC | PRN
  Administered 2016-07-27: 30 via INTRAVENOUS

## 2016-07-27 MED ORDER — TECHNETIUM TC 99M TETROFOSMIN IV KIT
10.0000 | PACK | Freq: Once | INTRAVENOUS | Status: AC | PRN
Start: 2016-07-27 — End: 2016-07-27
  Administered 2016-07-27: 10 via INTRAVENOUS

## 2016-07-27 MED ORDER — SODIUM CHLORIDE 0.9 % WEIGHT BASED INFUSION
1.0000 mL/kg/h | INTRAVENOUS | Status: DC
  Administered 2016-07-28: 1 mL/kg/h via INTRAVENOUS

## 2016-07-27 MED ORDER — METOPROLOL TARTRATE 25 MG PO TABS
25.0000 mg | ORAL_TABLET | Freq: Two times a day (BID) | ORAL | Status: DC
  Administered 2016-07-27: 25 mg via ORAL
  Filled 2016-07-27 (×2): qty 1

## 2016-07-27 NOTE — Progress Notes (Signed)
Reviewed myovue Suggests inferolateral ischemia at mid and basal level Also TID 1.8 with visible cavity dilatation during stress ECG also abnormal with ST deprssion in V56  Recommend cath in am PA to discuss with patient  Charlton Hawseter Nishan

## 2016-07-27 NOTE — Progress Notes (Addendum)
   Cynthia Stewart presented for a Treadmill Myoview today.  No immediate complications.  Stress imaging is pending at this time.  Ellsworth LennoxBrittany M Arletta Lumadue, PA-C 07/27/2016, 11:10 AM    Reviewed stress test results with Dr. Eden EmmsNishan. Cath recommended. Discussed stress test results and plan for cath with the patient. The patient understands that risks included but are not limited to stroke (1 in 1000), death (1 in 1000), kidney failure [usually temporary] (1 in 500), bleeding (1 in 200), allergic reaction [possibly serious] (1 in 200). She is in agreement to proceed. Scheduled for 07/28/2016.  Signed, Ellsworth LennoxBrittany M Laylaa Guevarra, PA-C 07/27/2016, 3:47 PM Pager: [phone]

## 2016-07-27 NOTE — Consult Note (Signed)
Cardiology Consult    Patient ID: Cayce Paschal MRN: [id number], DOB/AGE: 20-May-1950   Admit date: 07/26/2016 Date of Consult: 07/27/2016  Primary Physician: Porfirio Oar, PA-C Reason for Consult: Chest pain Primary Cardiologist: New Requesting Provider: Dr. Rogelia Boga  History of Present Illness    Aberdeen Hinchman is a 66 y.o. female who is being seen today for the evaluation of chest pain at the request of Dr. Rogelia Boga. The patient has a past medical history significant for diet controled diabetes, essential hypertension, and hyperlipidemia.  The patient has had a low level of chest pressure with intermittent more intense pressure lasting an hour or more for the last few months. She notes that she has been under a lot of stress at work. The pressure became more noticeable since Saturday. The pressure is in the upper chest and radiates down her left arm with numbness on the fingers. It occurs while she is at work and not when she is relaxed at home. She also occasionally notes a hard, fast heart beat with the increase in chest pressure and occ associated lightheadedness. Her symptoms are not related to activity and she denies exertional chest pain or dyspnea. She has no orthopnea. She has swelling of the feet at the end of her work day which resolves by morning.   She does not smoke or drink alcohol. She plans on quitting her job due to the stress.   Her cardiac enzymes are negative, EKG shows no acute ischemic changes, CXR is normal, D-dimer is not elevated.   Past Medical History   Past Medical History:  Diagnosis Date  . Allergy   . Hyperlipidemia   . Hypertension     Past Surgical History:  Procedure Laterality Date  . ABDOMINAL HYSTERECTOMY    . APPENDECTOMY    . BREAST LUMPECTOMY Bilateral   . CHOLECYSTECTOMY       Allergies  Allergies  Allergen Reactions  . Codeine Other (See Comments)    HALLUCINATION  . Percocet [Oxycodone-Acetaminophen] Itching and Other (See  Comments)    HALLUCINATION  . Trileptal [Oxcarbazepine]     Inpatient Medications    . enoxaparin (LOVENOX) injection  40 mg Subcutaneous Q24H  . losartan  100 mg Oral Daily   And  . hydrochlorothiazide  12.5 mg Oral Daily  . montelukast  10 mg Oral QHS  . rosuvastatin  5 mg Oral Daily    Family History    Family History  Problem Relation Age of Onset  . Hypertension Mother   . Diabetes Mother   . Hyperlipidemia Mother   . CVA Mother   . Rheum arthritis Father   . Hyperlipidemia Sister   . Hypertension Sister   . Hypertension Brother      Social History    Social History   Social History  . Marital status: Married    Spouse name: Fayrene Fearing  . Number of children: 3  . Years of education: High School   Occupational History  . 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 Smoking status: Former Games developer  . Smokeless tobacco: Never Used     Comment: as a teenager  . Alcohol use No  . Drug use: No  . Sexual activity: Yes    Partners: Male    Birth control/ protection: Surgical   Other Topics Concern  . Not on file   Social History Narrative   Lives with her husband.   Daughters live in [place], [place], [place], [place]  and [place], [place]    Exercise: No.     Review of Systems    General:  No chills, fever, night sweats or weight changes.  Cardiovascular:  Positive for chest pain as above and palpitations,  No dyspnea on exertion, edema, orthopnea,  paroxysmal nocturnal dyspnea. Dermatological: No rash, lesions/masses Respiratory: No cough, dyspnea Urologic: No hematuria, dysuria Abdominal:   No nausea, vomiting, diarrhea, bright red blood per rectum, melena, or hematemesis Neurologic:  No visual changes, wkns, changes in mental status. All other systems reviewed and are otherwise negative except as noted above.  Physical Exam   Blood pressure 127/70, pulse 64, temperature 98 F (36.7 C), temperature source Oral, resp. rate 12, height 5\' 4"  (1.626  m), weight 171 lb 14.4 oz (78 kg), SpO2 98 %.  General: Pleasant, NAD Psych: Normal affect. Neuro: Alert and oriented X 3. Moves all extremities spontaneously. HEENT: Normal  Neck: Supple without bruits or JVD. Lungs:  Resp regular and unlabored, CTA. Heart: RRR no s3, s4, or murmurs. Abdomen: Soft, non-tender, non-distended, BS + x 4.  Extremities: No clubbing, cyanosis or edema. DP/PT/Radials 2+ and equal bilaterally.  Labs    Troponin [facility](Point of Care Test)  Recent Labs  07/26/16 1709  TROPIPOC 0.00    Recent Labs  07/26/16 2055 07/27/16 0241  TROPONINI <0.03 <0.03   Lab Results  Component Value Date   WBC 6.3 07/26/2016   HGB 13.3 07/26/2016   HCT 39.1 07/26/2016   MCV 85.2 07/26/2016   PLT 215 07/26/2016    Recent Labs Lab 07/26/16 1700  NA 139  K 3.6  CL 105  CO2 25  BUN 11  CREATININE 0.91  CALCIUM 9.0  GLUCOSE 112*   Lab Results  Component Value Date   CHOL 271 (H) 06/14/2016   HDL 78 06/14/2016   LDLCALC 177 (H) 06/14/2016   TRIG 80 06/14/2016   Lab Results  Component Value Date   DDIMER 0.50 07/26/2016     Radiology Studies    Dg Chest 2 View  Result Date: 07/26/2016 CLINICAL DATA:  Shortness of breath and chest pain for several days EXAM: CHEST  2 VIEW COMPARISON:  None. FINDINGS: Changes consistent with prior median sternotomy are noted. Cardiac shadow is within normal limits. The lungs are clear. No other bony abnormality is seen. IMPRESSION: No active cardiopulmonary disease. Electronically Signed   By: Alcide CleverMark  Lukens M.D.   On: 07/26/2016 16:19    EKG & Cardiac Imaging    EKG: Sinus rhythm 97 bpm, abnormal r wave progression, QTC 459  Echocardiogram: none  Assessment & Plan    Chest pain -Atypical chest pain. Steady low level pressure off and on for a few months with intermittent intensity lasting an hour or more. Pt associates the worsening with stress at work, as does not occur at home. Also has occ lightheadedness and  palpitations.  -Troponins negative X 3 -Electrolyes and kidney function normal. D-Dimer 0.50. CBC normal -CXR no active cardiopulmonary disease -EKG without acute ischemic changes -Risk factors for CAD include DM, HTN, HLD -Low level of concern for cardiac ischemia. Advise stress test. Also concern for possible tachyarrhythmia. 5 beat run of irregular tachycardia noted on tele and pt c/o intermittent palpitations occurring with chest tightness and lightheadedness. If stress test normal, would follow up with outpatient event monitor.   Hypertension -Home meds include Hyzaar 100-12.5 mg daily -BP elevated initially, now improved   Diabetes -diet controlled -Hgb A1c 6.9 on 06/14/2016  Hyperlipidemia -Lipid  panel on 06/14/16: TC 271, LDL 177, HDL 78, Trig 80 -On Crestor 5 mg. Clearly needs better lipid management in setting of diabetes, however, she has myalgias with statins and has tried multiple including lipitor. She is only able to tolerate crestor twice a week.    Oletta Cohn, NP-C 07/27/2016, 7:25 AM Pager: [phone]  Patient examined chart reviewed. Discussed care with patient and daughters. 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 R/O no acute ECG changes CXR ok She can walk well changed myvoue to exercise. Can be d/c latter today if normal  Charlton Haws

## 2016-07-27 NOTE — Plan of Care (Signed)
Problem: Safety: Goal: Ability to remain free from injury will improve Outcome: Progressing Safety bundle in place. No injuries this shift. Will continue to monitor and perform hourly rounding.

## 2016-07-27 NOTE — Progress Notes (Signed)
   Subjective: Patient was feeling better when seen this morning. She denies any more chest pain or shortness of breath. She do endorse getting occasional palpitations accompanied with some dizziness. She also admitted a lot of stresses at work and was thinking about quitting her job. She also admitted getting lower extremity edema at the end of the day which normally resolved by morning.  Objective:  Vital signs in last 24 hours: Vitals:   07/27/16 1056 07/27/16 1100 07/27/16 1104 07/27/16 1424  BP: (!) 217/104 (!) 209/101  134/74  Pulse: (!) 132 (!) 117 92 81  Resp:    14  Temp:    98.9 F (37.2 C)  TempSrc:    Oral  SpO2:    99%  Weight:      Height:       Gen. Well-developed lady, lying comfortably in bed, in no acute distress. Lungs. Clear bilaterally. CV. Regular rate and rhythm. Abdomen. Soft, nontender, bowel sounds positive. Extremities. No edema, no cyanosis, pulses 2+ bilaterally.  Assessment/Plan:  66 yo woman with PMH hypertension, hld, diabetes mellitus, and anxiety presents from her PCPs office with chest heaviness which began 5/5. The chest heaviness was initially intermittent on Saturday but became worse Sunday and constant Monday.  Chest pain. her EKG remained without any acute change and troponin remained negative. Cardiology is following and they will do a stress test today. If the results comes back low risk she can be discharged. She might need event recorder because of her history of intermittent palpitations accompanied with mild dizziness. She will follow up with cardiology as an outpatient. -Continue Crestor 5 mg twice a week, she was unable to tolerate higher doses of statin in the past because of myalgias.  Hypertension. Remained mostly normotensive since this morning, except to elevated readings most likely during stress testing. -Continue her home dose of Hyzaar combination of losartan and hydrochlorothiazide.  Estrogen replacement therapy. She can  work with her PCP to taper off her estrogen.  Type 2 Diabetes mellitus  Last A1c 05/2016 6.9, diet controlled.   Anxiety  - home med Lorazepam PRN   Seasonal allergies  -continue home med loratadine and montelukast    Dispo: Can be discharged today if stress test came back negative.  Cynthia Stewart, Jeffory Snelgrove, MD 07/27/2016, 2:43 PM Pager: [phone]

## 2016-07-28 ENCOUNTER — Encounter (HOSPITAL_COMMUNITY)
Admission: EM | Disposition: A | Discharge status: Home / Self Care | Payer: Self-pay | Source: Home / Self Care | Attending: Emergency Medicine

## 2016-07-28 DIAGNOSIS — I10 Essential (primary) hypertension: Secondary | ICD-10-CM | POA: Diagnosis not present

## 2016-07-28 DIAGNOSIS — R0789 Other chest pain: Secondary | ICD-10-CM | POA: Diagnosis not present

## 2016-07-28 DIAGNOSIS — R9439 Abnormal result of other cardiovascular function study: Secondary | ICD-10-CM

## 2016-07-28 DIAGNOSIS — E119 Type 2 diabetes mellitus without complications: Secondary | ICD-10-CM

## 2016-07-28 DIAGNOSIS — Z79818 Long term (current) use of other agents affecting estrogen receptors and estrogen levels: Secondary | ICD-10-CM | POA: Diagnosis not present

## 2016-07-28 DIAGNOSIS — R079 Chest pain, unspecified: Secondary | ICD-10-CM | POA: Diagnosis not present

## 2016-07-28 HISTORY — PX: LEFT HEART CATH AND CORONARY ANGIOGRAPHY: CATH118249

## 2016-07-28 LAB — PROTIME-INR
INR: 0.94
PROTHROMBIN TIME: 12.6 s (ref 11.4–15.2)

## 2016-07-28 SURGERY — LEFT HEART CATH AND CORONARY ANGIOGRAPHY
Anesthesia: LOCAL

## 2016-07-28 MED ORDER — HEPARIN SODIUM (PORCINE) 1000 UNIT/ML IJ SOLN
INTRAMUSCULAR | Status: DC | PRN
  Administered 2016-07-28: 4000 [IU] via INTRAVENOUS

## 2016-07-28 MED ORDER — SODIUM CHLORIDE 0.9 % IV SOLN: 250.0000 mL | INTRAVENOUS | Status: DC | PRN

## 2016-07-28 MED ORDER — ENOXAPARIN SODIUM 40 MG/0.4ML ~~LOC~~ SOLN: 40.0000 mg | SUBCUTANEOUS | Status: DC

## 2016-07-28 MED ORDER — VERAPAMIL HCL 2.5 MG/ML IV SOLN
INTRAVENOUS | Status: AC
  Filled 2016-07-28: qty 2

## 2016-07-28 MED ORDER — VERAPAMIL HCL 2.5 MG/ML IV SOLN
INTRAVENOUS | Status: DC | PRN
  Administered 2016-07-28: 10 mL via INTRA_ARTERIAL

## 2016-07-28 MED ORDER — FENTANYL CITRATE (PF) 100 MCG/2ML IJ SOLN
INTRAMUSCULAR | Status: DC | PRN
  Administered 2016-07-28 (×2): 25 ug via INTRAVENOUS

## 2016-07-28 MED ORDER — MIDAZOLAM HCL 2 MG/2ML IJ SOLN
INTRAMUSCULAR | Status: AC
  Filled 2016-07-28: qty 2

## 2016-07-28 MED ORDER — SODIUM CHLORIDE 0.9% FLUSH: 3.0000 mL | INTRAVENOUS | Status: DC | PRN

## 2016-07-28 MED ORDER — HEPARIN (PORCINE) IN NACL 2-0.9 UNIT/ML-% IJ SOLN
INTRAMUSCULAR | Status: AC
Start: 2016-07-28 — End: 2016-07-28
  Filled 2016-07-28: qty 1000

## 2016-07-28 MED ORDER — HEPARIN (PORCINE) IN NACL 2-0.9 UNIT/ML-% IJ SOLN
INTRAMUSCULAR | Status: AC | PRN
  Administered 2016-07-28: 1000 mL

## 2016-07-28 MED ORDER — HEPARIN SODIUM (PORCINE) 1000 UNIT/ML IJ SOLN
INTRAMUSCULAR | Status: AC
  Filled 2016-07-28: qty 1

## 2016-07-28 MED ORDER — LIDOCAINE HCL (PF) 1 % IJ SOLN
INTRAMUSCULAR | Status: AC
  Filled 2016-07-28: qty 30

## 2016-07-28 MED ORDER — IOPAMIDOL (ISOVUE-370) INJECTION 76%
INTRAVENOUS | Status: DC | PRN
  Administered 2016-07-28: 60 mL via INTRAVENOUS

## 2016-07-28 MED ORDER — SODIUM CHLORIDE 0.9 % IV SOLN: INTRAVENOUS | Status: AC

## 2016-07-28 MED ORDER — LIDOCAINE HCL (PF) 1 % IJ SOLN
INTRAMUSCULAR | Status: DC | PRN
  Administered 2016-07-28: 2 mL

## 2016-07-28 MED ORDER — FENTANYL CITRATE (PF) 100 MCG/2ML IJ SOLN
INTRAMUSCULAR | Status: AC
  Filled 2016-07-28: qty 2

## 2016-07-28 MED ORDER — MIDAZOLAM HCL 2 MG/2ML IJ SOLN
INTRAMUSCULAR | Status: DC | PRN
  Administered 2016-07-28: 2 mg via INTRAVENOUS

## 2016-07-28 MED ORDER — SODIUM CHLORIDE 0.9% FLUSH: 3.0000 mL | Freq: Two times a day (BID) | INTRAVENOUS | Status: DC

## 2016-07-28 MED ORDER — IOPAMIDOL (ISOVUE-370) INJECTION 76%
INTRAVENOUS | Status: AC
  Filled 2016-07-28: qty 100

## 2016-07-28 SURGICAL SUPPLY — 13 items
CATH INFINITI 5 FR JL3.5 (CATHETERS) ×2 IMPLANT
CATH INFINITI 5FR ANG PIGTAIL (CATHETERS) ×2 IMPLANT
CATH OPTITORQUE TIG 4.0 5F (CATHETERS) ×2 IMPLANT
COVER PRB 48X5XTLSCP FOLD TPE (BAG) ×1 IMPLANT
COVER PROBE 5X48 (BAG) ×1
DEVICE RAD COMP TR BAND LRG (VASCULAR PRODUCTS) ×2 IMPLANT
GLIDESHEATH SLEND A-KIT 6F 22G (SHEATH) ×2 IMPLANT
GUIDEWIRE INQWIRE 1.5J.035X260 (WIRE) ×1 IMPLANT
INQWIRE 1.5J .035X260CM (WIRE) ×2
KIT HEART LEFT (KITS) ×2 IMPLANT
PACK CARDIAC CATHETERIZATION (CUSTOM PROCEDURE TRAY) ×2 IMPLANT
TRANSDUCER W/STOPCOCK (MISCELLANEOUS) ×2 IMPLANT
TUBING CIL FLEX 10 FLL-RA (TUBING) ×2 IMPLANT

## 2016-07-28 NOTE — Care Management Note (Signed)
Case Management Note  Patient Details  Name: Cynthia Stewart MRN: [id number] Date of Birth: 08-18-1950  Subjective/Objective:  Pt presented for Chest Pain. Abnormal Stress test plan for Cardiac Cath 07-28-16. PTA- Independent from home and plan is to return home once stable for d/c.                   Action/Plan: No home needs identified at time of visit.   Expected Discharge Date:                  Expected Discharge Plan:  Home/Self Care  In-House Referral:  NA  Discharge planning Services  CM Consult  Post Acute Care Choice:  NA Choice offered to:  NA  DME Arranged:  N/A DME Agency:  NA  HH Arranged:  NA HH Agency:  NA  Status of Service:  Completed, signed off  If discussed at Long Length of Stay Meetings, dates discussed:    Additional Comments:  Gala LewandowskyGraves-Bigelow, Cooper Moroney Kaye, RN 07/28/2016, 2:44 PM

## 2016-07-28 NOTE — Plan of Care (Signed)
Problem: Safety: Goal: Ability to remain free from injury will improve Outcome: Progressing Fall risk bundle in place. No injuries this shift.

## 2016-07-28 NOTE — H&P (View-Only) (Signed)
Progress Note  Patient Name: Cynthia Stewart Date of Encounter: 07/28/2016  Primary Cardiologist: New- Dr. Eden EmmsNishan  Subjective   No chest pain, dyspnea or palpitations.   Inpatient Medications    Scheduled Meds: . enoxaparin (LOVENOX) injection  40 mg Subcutaneous Q24H  . losartan  100 mg Oral Daily   And  . hydrochlorothiazide  12.5 mg Oral Daily  . metoprolol tartrate  25 mg Oral BID  . montelukast  10 mg Oral QHS  . rosuvastatin  5 mg Oral Daily  . sodium chloride flush  3 mL Intravenous Q12H   Continuous Infusions: . sodium chloride    . sodium chloride 1 mL/kg/hr (07/28/16 0707)   PRN Meds: sodium chloride, acetaminophen, azelastine, clonazePAM, loratadine, nitroGLYCERIN, ondansetron (ZOFRAN) IV, sodium chloride flush   Vital Signs    Vitals:   07/27/16 1104 07/27/16 1424 07/27/16 2019 07/28/16 0356  BP:  134/74 129/67 126/66  Pulse: 92 81 66 75  Resp:  14 15 18   Temp:  98.9 F (37.2 C) 98.4 F (36.9 C) 97.9 F (36.6 C)  TempSrc:  Oral    SpO2:  99% 99% 97%  Weight:    173 lb 3.2 oz (78.6 kg)  Height:        Intake/Output Summary (Last 24 hours) at 07/28/16 0817 Last data filed at 07/28/16 0357  Gross per 24 hour  Intake              360 ml  Output                0 ml  Net              360 ml   Filed Weights   07/26/16 2033 07/27/16 0612 07/28/16 0356  Weight: 172 lb (78 kg) 171 lb 14.4 oz (78 kg) 173 lb 3.2 oz (78.6 kg)    Telemetry    NSR 60's-70's, no ectopy - Personally Reviewed  ECG    No new tracings  Physical Exam   GEN: No acute distress.   Neck: No JVD Cardiac: RRR, no murmurs, rubs, or gallops.  Respiratory: Clear to auscultation bilaterally. GI: Soft, nontender, non-distended  MS: No edema; No deformity. Neuro:  Nonfocal  Psych: Normal affect   Labs    Chemistry Recent Labs Lab 07/26/16 1700 07/27/16 0631  NA 139 137  K 3.6 3.6  CL 105 102  CO2 25 26  GLUCOSE 112* 128*  BUN 11 13  CREATININE 0.91 1.04*    CALCIUM 9.0 9.0  GFRNONAA >60 56*  GFRAA >60 >60  ANIONGAP 9 9     Hematology Recent Labs Lab 07/26/16 1347 07/26/16 1700  WBC 6.3 6.3  RBC 4.74 4.59  HGB 14.0 13.3  HCT 40.6 39.1  MCV 85.7 85.2  MCH 29.6 29.0  MCHC 34.5 34.0  RDW  --  13.7  PLT  --  215    Cardiac Enzymes Recent Labs Lab 07/26/16 2055 07/27/16 0241 07/27/16 0631  TROPONINI <0.03 <0.03 <0.03    Recent Labs Lab 07/26/16 1709  TROPIPOC 0.00     BNPNo results for input(s): BNP, PROBNP in the last 168 hours.   DDimer  Recent Labs Lab 07/26/16 1700  DDIMER 0.50     Radiology    Dg Chest 2 View  Result Date: 07/26/2016 CLINICAL DATA:  Shortness of breath and chest pain for several days EXAM: CHEST  2 VIEW COMPARISON:  None. FINDINGS: Changes consistent with prior median sternotomy are noted. Cardiac  shadow is within normal limits. The lungs are clear. No other bony abnormality is seen. IMPRESSION: No active cardiopulmonary disease. Electronically Signed   By: Alcide Clever M.D.   On: 07/26/2016 16:19   Nm Myocar Multi W/spect W/wall Motion / Ef  Result Date: 07/27/2016 CLINICAL DATA:  Chest pain. Diabetes (dye control). Hypertension. Hyperlipidemia. EXAM: MYOCARDIAL IMAGING WITH SPECT (REST AND EXERCISE) GATED LEFT VENTRICULAR WALL MOTION STUDY LEFT VENTRICULAR EJECTION FRACTION TECHNIQUE: Standard myocardial SPECT imaging was performed after resting intravenous injection of 10 mCi Tc-37m tetrofosmin. Subsequently, exercise tolerance test was performed by the patient under the supervision of the Cardiology staff. At peak-stress, 30 mCi Tc-54m tetrofosmin was injected intravenously and standard myocardial SPECT imaging was performed. Quantitative gated imaging was also performed to evaluate left ventricular wall motion, and estimate left ventricular ejection fraction. COMPARISON:  None. FINDINGS: Perfusion: Mild severity small size inferolateral area of reduced perfusion on stress images compared to rest  images, with 11% reduction in activity compatible with mild inducible ischemia. Wall Motion: Normal left ventricular wall motion. No left ventricular dilation. Left Ventricular Ejection Fraction: 73 % End diastolic volume 58 ml End systolic volume 16 ml IMPRESSION: 1. Small region of mild inducible ischemia in the inferolateral wall. 2. Normal left ventricular wall motion. 3. Left ventricular ejection fraction 73% 4. Non invasive risk stratification*: Low *2012 Appropriate Use Criteria for Coronary Revascularization Focused Update: J Am Coll Cardiol. 2012;59(9):857-881. http://content.dementiazones.com.aspx?articleid=[email or web address] Electronically Signed   By: Gaylyn Rong M.D.   On: 07/27/2016 15:06    Cardiac Studies   Nuclear treadmill perfusion testing 07/27/2016  FINDINGS: Perfusion: Mild severity small size inferolateral area of reduced perfusion on stress images compared to rest images, with 11% reduction in activity compatible with mild inducible ischemia.  Wall Motion: Normal left ventricular wall motion. No left ventricular dilation.  Left Ventricular Ejection Fraction: 73 %  End diastolic volume 58 ml  End systolic volume 16 ml  IMPRESSION: 1. Small region of mild inducible ischemia in the inferolateral wall.  2. Normal left ventricular wall motion.  3. Left ventricular ejection fraction 73%   Patient Profile     66 y.o. female has a past medical history significant for diet controled diabetes, essential hypertension, and hyperlipidemia. She was admitted for evaluation of chest pain. Troponins negative, EKG without ischemic changes.   Assessment & Plan    Chest pain -No further chest pain -Negative troponins, EKG without ischemic changes -Exercise myoview done yesterday: Suggests inferolateral ischemia at mid and basal level. Also TID 1.8 with visible cavity dilatation during stress. ECG also abnormal with ST deprssion in V56. Recommend cath. -For cardiac  cath today for definitive cardiac evaluation. SCr 1.04  Hypertension -Home meds include Hyzaar 100-12.5 mg daily -BP elevated initially, now improved   Diabetes -diet controlled -Hgb A1c 6.9 on 06/14/2016  Hyperlipidemia -Lipid panel on 06/14/16: TC 271, LDL 177, HDL 78, Trig 80 -On Crestor 5 mg. Clearly needs better lipid management in setting of diabetes, however, she has myalgias with statins and has tried multiple including lipitor. She is only able to tolerate crestor twice a week.    Signed, Berton Bon, NP  07/28/2016, 8:17 AM    Patient examined chart reviewed. BP improved Diabetes moderate control with A1c 6.9 On statin crestor twice / week will need referral to lipid clinic for praluent if found to have CAD needing intervention Discussed cath at length with daughter and patient ready to proceed. On for Dr Eldridge Dace 3:00 today  Charlton Haws

## 2016-07-28 NOTE — Interval H&P Note (Signed)
History and Physical Interval Note:  07/28/2016 5:14 PM  Cynthia Stewart  has presented today for surgery, with the diagnosis of abnormal stress test ordered for atypical chest pain.  The various methods of treatment have been discussed with the patient and family. After consideration of risks, benefits and other options for treatment, the patient has consented to  Procedure(s): Left Heart Cath and Coronary Angiography (N/A) with possible Percutaneous Coronary Intervention as a surgical intervention .  The patient's history has been reviewed, patient examined, no change in status, stable for surgery.  I have reviewed the patient's chart and labs.  Questions were answered to the patient's satisfaction.    Cath Lab Visit (complete for each Cath Lab visit)  Clinical Evaluation Leading to the Procedure:   ACS: No.  Non-ACS:    Anginal Classification: CCS II  Anti-ischemic medical therapy: Maximal Therapy (2 or more classes of medications)  Non-Invasive Test Results: Intermediate-risk stress test findings: cardiac mortality 1-3%/year - reading physician suggested low risk, but on my review and Dr. Glory RosebushNations reviewed, at least intermediate risk.  Prior CABG: No previous CABG   Bryan Lemmaavid Harding

## 2016-07-28 NOTE — Progress Notes (Signed)
Progress Note  Patient Name: Cynthia Stewart Date of Encounter: 07/28/2016  Primary Cardiologist: New- Dr. Eden EmmsNishan  Subjective   No chest pain, dyspnea or palpitations.   Inpatient Medications    Scheduled Meds: . enoxaparin (LOVENOX) injection  40 mg Subcutaneous Q24H  . losartan  100 mg Oral Daily   And  . hydrochlorothiazide  12.5 mg Oral Daily  . metoprolol tartrate  25 mg Oral BID  . montelukast  10 mg Oral QHS  . rosuvastatin  5 mg Oral Daily  . sodium chloride flush  3 mL Intravenous Q12H   Continuous Infusions: . sodium chloride    . sodium chloride 1 mL/kg/hr (07/28/16 0707)   PRN Meds: sodium chloride, acetaminophen, azelastine, clonazePAM, loratadine, nitroGLYCERIN, ondansetron (ZOFRAN) IV, sodium chloride flush   Vital Signs    Vitals:   07/27/16 1104 07/27/16 1424 07/27/16 2019 07/28/16 0356  BP:  134/74 129/67 126/66  Pulse: 92 81 66 75  Resp:  14 15 18   Temp:  98.9 F (37.2 C) 98.4 F (36.9 C) 97.9 F (36.6 C)  TempSrc:  Oral    SpO2:  99% 99% 97%  Weight:    173 lb 3.2 oz (78.6 kg)  Height:        Intake/Output Summary (Last 24 hours) at 07/28/16 0817 Last data filed at 07/28/16 0357  Gross per 24 hour  Intake              360 ml  Output                0 ml  Net              360 ml   Filed Weights   07/26/16 2033 07/27/16 0612 07/28/16 0356  Weight: 172 lb (78 kg) 171 lb 14.4 oz (78 kg) 173 lb 3.2 oz (78.6 kg)    Telemetry    NSR 60's-70's, no ectopy - Personally Reviewed  ECG    No new tracings  Physical Exam   GEN: No acute distress.   Neck: No JVD Cardiac: RRR, no murmurs, rubs, or gallops.  Respiratory: Clear to auscultation bilaterally. GI: Soft, nontender, non-distended  MS: No edema; No deformity. Neuro:  Nonfocal  Psych: Normal affect   Labs    Chemistry Recent Labs Lab 07/26/16 1700 07/27/16 0631  NA 139 137  K 3.6 3.6  CL 105 102  CO2 25 26  GLUCOSE 112* 128*  BUN 11 13  CREATININE 0.91 1.04*    CALCIUM 9.0 9.0  GFRNONAA >60 56*  GFRAA >60 >60  ANIONGAP 9 9     Hematology Recent Labs Lab 07/26/16 1347 07/26/16 1700  WBC 6.3 6.3  RBC 4.74 4.59  HGB 14.0 13.3  HCT 40.6 39.1  MCV 85.7 85.2  MCH 29.6 29.0  MCHC 34.5 34.0  RDW  --  13.7  PLT  --  215    Cardiac Enzymes Recent Labs Lab 07/26/16 2055 07/27/16 0241 07/27/16 0631  TROPONINI <0.03 <0.03 <0.03    Recent Labs Lab 07/26/16 1709  TROPIPOC 0.00     BNPNo results for input(s): BNP, PROBNP in the last 168 hours.   DDimer  Recent Labs Lab 07/26/16 1700  DDIMER 0.50     Radiology    Dg Chest 2 View  Result Date: 07/26/2016 CLINICAL DATA:  Shortness of breath and chest pain for several days EXAM: CHEST  2 VIEW COMPARISON:  None. FINDINGS: Changes consistent with prior median sternotomy are noted. Cardiac  shadow is within normal limits. The lungs are clear. No other bony abnormality is seen. IMPRESSION: No active cardiopulmonary disease. Electronically Signed   By: Alcide Clever M.D.   On: 07/26/2016 16:19   Nm Myocar Multi W/spect W/wall Motion / Ef  Result Date: 07/27/2016 CLINICAL DATA:  Chest pain. Diabetes (dye control). Hypertension. Hyperlipidemia. EXAM: MYOCARDIAL IMAGING WITH SPECT (REST AND EXERCISE) GATED LEFT VENTRICULAR WALL MOTION STUDY LEFT VENTRICULAR EJECTION FRACTION TECHNIQUE: Standard myocardial SPECT imaging was performed after resting intravenous injection of 10 mCi Tc-37m tetrofosmin. Subsequently, exercise tolerance test was performed by the patient under the supervision of the Cardiology staff. At peak-stress, 30 mCi Tc-54m tetrofosmin was injected intravenously and standard myocardial SPECT imaging was performed. Quantitative gated imaging was also performed to evaluate left ventricular wall motion, and estimate left ventricular ejection fraction. COMPARISON:  None. FINDINGS: Perfusion: Mild severity small size inferolateral area of reduced perfusion on stress images compared to rest  images, with 11% reduction in activity compatible with mild inducible ischemia. Wall Motion: Normal left ventricular wall motion. No left ventricular dilation. Left Ventricular Ejection Fraction: 73 % End diastolic volume 58 ml End systolic volume 16 ml IMPRESSION: 1. Small region of mild inducible ischemia in the inferolateral wall. 2. Normal left ventricular wall motion. 3. Left ventricular ejection fraction 73% 4. Non invasive risk stratification*: Low *2012 Appropriate Use Criteria for Coronary Revascularization Focused Update: J Am Coll Cardiol. 2012;59(9):857-881. http://content.dementiazones.com.aspx?articleid=[email or web address] Electronically Signed   By: Gaylyn Rong M.D.   On: 07/27/2016 15:06    Cardiac Studies   Nuclear treadmill perfusion testing 07/27/2016  FINDINGS: Perfusion: Mild severity small size inferolateral area of reduced perfusion on stress images compared to rest images, with 11% reduction in activity compatible with mild inducible ischemia.  Wall Motion: Normal left ventricular wall motion. No left ventricular dilation.  Left Ventricular Ejection Fraction: 73 %  End diastolic volume 58 ml  End systolic volume 16 ml  IMPRESSION: 1. Small region of mild inducible ischemia in the inferolateral wall.  2. Normal left ventricular wall motion.  3. Left ventricular ejection fraction 73%   Patient Profile     66 y.o. female has a past medical history significant for diet controled diabetes, essential hypertension, and hyperlipidemia. She was admitted for evaluation of chest pain. Troponins negative, EKG without ischemic changes.   Assessment & Plan    Chest pain -No further chest pain -Negative troponins, EKG without ischemic changes -Exercise myoview done yesterday: Suggests inferolateral ischemia at mid and basal level. Also TID 1.8 with visible cavity dilatation during stress. ECG also abnormal with ST deprssion in V56. Recommend cath. -For cardiac  cath today for definitive cardiac evaluation. SCr 1.04  Hypertension -Home meds include Hyzaar 100-12.5 mg daily -BP elevated initially, now improved   Diabetes -diet controlled -Hgb A1c 6.9 on 06/14/2016  Hyperlipidemia -Lipid panel on 06/14/16: TC 271, LDL 177, HDL 78, Trig 80 -On Crestor 5 mg. Clearly needs better lipid management in setting of diabetes, however, she has myalgias with statins and has tried multiple including lipitor. She is only able to tolerate crestor twice a week.    Signed, Berton Bon, NP  07/28/2016, 8:17 AM    Patient examined chart reviewed. BP improved Diabetes moderate control with A1c 6.9 On statin crestor twice / week will need referral to lipid clinic for praluent if found to have CAD needing intervention Discussed cath at length with daughter and patient ready to proceed. On for Dr Eldridge Dace 3:00 today  Charlton Haws

## 2016-07-28 NOTE — Discharge Summary (Signed)
Name: Cynthia Stewart MRN: [id number] DOB: Dec 01, 1950 66 y.o. PCP: Cynthia Stewart, Chelle, PA-C  Date of Admission: 07/26/2016  2:50 PM Date of Discharge: 07/29/2016 Attending Physician: Burns SpainButcher, Elizabeth A, MD  Discharge Diagnosis: 1. Noncardiogenic chest pain. 2. Dyslipidemia 3. Hypertension.    Discharge Medications: Allergies as of 07/29/2016      Reactions   Codeine Other (See Comments)   HALLUCINATION   Percocet [oxycodone-acetaminophen] Itching, Other (See Comments)   HALLUCINATION   Trileptal [oxcarbazepine]       Medication List    TAKE these medications   azelastine 0.1 % nasal spray Commonly known as:  ASTELIN Place 2 sprays into both nostrils 2 (two) times daily. Use in each nostril as directed   clonazePAM 0.5 MG tablet Commonly known as:  KLONOPIN Take 1 tablet (0.5 mg total) by mouth 2 (two) times daily as needed for anxiety. What changed:  when to take this   estradiol 0.05 MG/24HR patch Commonly known as:  VIVELLE-DOT APPLY 1 PATCH(0.05 MG) EXTERNALLY TO THE SKIN 2 TIMES A WEEK   fexofenadine 180 MG tablet Commonly known as:  ALLEGRA Take 180 mg by mouth daily.   losartan-hydrochlorothiazide 100-12.5 MG tablet Commonly known as:  HYZAAR Take 1 tablet by mouth daily.   montelukast 10 MG tablet Commonly known as:  SINGULAIR Take 1 tablet (10 mg total) by mouth at bedtime.   nitroGLYCERIN 0.4 MG SL tablet Commonly known as:  NITROSTAT Place 1 tablet (0.4 mg total) under the tongue every 5 (five) minutes as needed for chest pain.   rosuvastatin 5 MG tablet Commonly known as:  CRESTOR Take 1 tablet (5 mg total) by mouth daily.       Disposition and follow-up:   [name] [facility].  At the hospital follow up visit please address:  1.  Her blood pressure as she needs a good control of blood pressure. -If she continued to complain of occasional palpitations associated with  dizziness she might need a cardiac event recorder. We did not find any arrhythmia during her stay in hospital.  2.  Labs / imaging needed at time of follow-up: None  3.  Pending labs/ test needing follow-up: None  Follow-up Appointments: Follow-up Information    Cynthia Stewart, Chelle, PA-C. Schedule an appointment as soon as possible for a visit.   Specialty:  Family Medicine Why:  To be seen by next week. Contact information: [address] [place] [id number] [phone]           Hospital Course by problem list:  66 yo woman with PMH hypertension, hld, diabetes mellitus, and anxiety presents from her PCPs office with chest heaviness which began 5/5. The chest heaviness was initially intermittent on Saturday but became worse Sunday and constant Monday.  Chest pain.Her chest pain improved with sublingual nitroglycerin, her EKG remained without any acute change and troponin remained negative. Because of her risk factors cardiology decided to do a inpatient stress testing which was abnormal showing reversible inferolateral ischemia. She had her cardiac catheterization done the next Day which does not show any obstruction. Patient remained asymptomatic during her stay in hospital and her telling me today does not show any arrhythmia. She was discharged home on her home dose of Crestor and aspirin.  Dyslipidemia. She is just on Crestor 5 mg twice a week, she needs more statin because of her ASCVD score. According to records she was unable to tolerate statins because of myalgias.Cardiology  arranging an appointment in their hyperlipidemia clinic to explore other options.  Hypertension.Remained normotensive. She will Continue her home dose of Hyzaar combination of losartan and hydrochlorothiazide.  Estrogen replacement therapy.She can work with her PCP to taper off her estrogen.  Type 2 Diabetes mellitus  Last A1c 05/2016 6.9, diet controlled.   Anxiety  - home med Lorazepam  PRN   Seasonal allergies  -continue home med loratadine and montelukast    Discharge Vitals:   BP 130/80   Pulse 60   Temp 98.1 F (36.7 C)   Resp 16   Ht 5\' 4"  (1.626 m)   Wt 174 lb 9.6 oz (79.2 kg)   SpO2 97%   BMI 29.97 kg/m   Gen.Well-developed lady,Sitting comfortably,in no acute distress. Lungs.Clear bilaterally. CV.Regular rate and rhythm. Abdomen.Soft, nontender, bowel sounds positive. Extremities.No edema, no cyanosis, pulses 2+ bilaterally.  Pertinent Labs, Studies, and Procedures:  CBC Latest Ref Rng & Units 07/26/2016 07/26/2016 06/14/2016  WBC 4.0 - 10.5 K/uL 6.3 6.3 5.5  Hemoglobin 12.0 - 15.0 g/dL 96.2 95.2 -  Hematocrit 36.0 - 46.0 % 39.1 40.6 40.1  Platelets 150 - 400 K/uL 215 - 227   BMP Latest Ref Rng & Units 07/27/2016 07/26/2016 06/14/2016  Glucose 65 - 99 mg/dL 841(L) 244(W) 102(V)  BUN 6 - 20 mg/dL 13 11 15   Creatinine 0.44 - 1.00 mg/dL 2.53(G) 6.44 0.34  BUN/Creat Ratio 12 - 28 - - 16  Sodium 135 - 145 mmol/L 137 139 143  Potassium 3.5 - 5.1 mmol/L 3.6 3.6 4.4  Chloride 101 - 111 mmol/L 102 105 102  CO2 22 - 32 mmol/L 26 25 26   Calcium 8.9 - 10.3 mg/dL 9.0 9.0 9.4   Troponin. <0.03 X 3. D-dimer.  0.50  EKG: Sinus rhythm 97 bpm, abnormal r wave progression, QTC 459   Dg Chest 2 View  Result Date: 07/26/2016 CLINICAL DATA:  Shortness of breath and chest pain for several days EXAM: CHEST  2 VIEW COMPARISON:  None. FINDINGS: Changes consistent with prior median sternotomy are noted. Cardiac shadow is within normal limits. The lungs are clear. No other bony abnormality is seen. IMPRESSION: No active cardiopulmonary disease. Electronically Signed   By: Alcide Clever M.D.   On: 07/26/2016 16:19    NM Myocar Multi W/Spect W/Wall Motion / EF. FINDINGS: Perfusion: Mild severity small size inferolateral area of reduced perfusion on stress images compared to rest images, with 11% reduction in activity compatible with mild inducible ischemia.  Wall  Motion: Normal left ventricular wall motion. No left ventricular dilation.  Left Ventricular Ejection Fraction: 73 %  End diastolic volume 58 ml  End systolic volume 16 ml  IMPRESSION: 1. Small region of mild inducible ischemia in the inferolateral wall.  2. Normal left ventricular wall motion.  3. Left ventricular ejection fraction 73%  4. Non invasive risk stratification*: Low  Cardiac Cath. (07/28/16) Procedures   Left Heart Cath and Coronary Angiography  Conclusion     Angiographically no coronary artery disease. Extremely tortuous vessels suggestive of long-standing hypertension.  There is hyperdynamic left ventricular systolic function.  The left ventricular ejection fraction is greater than 65% by visual estimate.  LV end diastolic pressure is normal.  There is no mitral valve regurgitation.  Likely false positive nuclear stress test. Recommend aggressive management of hypertension.      Discharge Instructions: Discharge Instructions    Diet - low sodium heart healthy    Complete by:  As directed  Increase activity slowly    Complete by:  As directed       Signed: Arnetha Courser, MD 07/29/2016, 1:06 PM   Pager: [phone]

## 2016-07-28 NOTE — Progress Notes (Signed)
   Subjective: Patient had no complaints when seen this morning. She denied any more chest pain or shortness of breath. She was waiting for cardiac catheterization which will be done later today.  Objective:  Vital signs in last 24 hours: Vitals:   07/27/16 1104 07/27/16 1424 07/27/16 2019 07/28/16 0356  BP:  134/74 129/67 126/66  Pulse: 92 81 66 75  Resp:  14 15 18   Temp:  98.9 F (37.2 C) 98.4 F (36.9 C) 97.9 F (36.6 C)  TempSrc:  Oral    SpO2:  99% 99% 97%  Weight:    173 lb 3.2 oz (78.6 kg)  Height:       Gen. Well-developed lady,in no acute distress. Lungs. Clear bilaterally. CV. Regular rate and rhythm. Abdomen. Soft, nontender, bowel sounds positive. Extremities. No edema, no cyanosis, pulses 2+ bilaterally.  Assessment/Plan:  66 yo woman with PMH hypertension, hld, diabetes mellitus, and anxiety presents from her PCPs office with chest heaviness which began 5/5. The chest heaviness was initially intermittent on Saturday but became worse Sunday and constant Monday.  Chest pain.  although her EKG and troponins remained within normal limit .Her stress test showed reversible inferolateral ischemia. She is going for cardiac catheterization for definitive diagnosis of CAD and any possible intervention if needed. -Continue Crestor according to her home regimen and she will need a little more than that if she definitely has a diagnosis of CAD. Cardiology is trying to arrange an appointment in their hyperlipidemia clinic to explore other options.  Hypertension.remained normotensive. -Continue her home dose of Hyzaar combination of losartan and hydrochlorothiazide.  Estrogen replacement therapy. She can work with her PCP to taper off her estrogen.  Type 2 Diabetes mellitus  Last A1c 05/2016 6.9, diet controlled.   Anxiety  - home med Lorazepam PRN   Seasonal allergies  -continue home med loratadine and montelukast    Dispo: Anticipated discharge in approximately 1  day(s).   Arnetha CourserAmin, Camillia Marcy, MD 07/28/2016, 11:14 AM Pager: [phone]

## 2016-07-29 ENCOUNTER — Encounter (HOSPITAL_COMMUNITY): Payer: Self-pay | Admitting: Cardiology

## 2016-07-29 DIAGNOSIS — F419 Anxiety disorder, unspecified: Secondary | ICD-10-CM | POA: Diagnosis not present

## 2016-07-29 DIAGNOSIS — R079 Chest pain, unspecified: Secondary | ICD-10-CM | POA: Diagnosis not present

## 2016-07-29 DIAGNOSIS — E785 Hyperlipidemia, unspecified: Secondary | ICD-10-CM | POA: Diagnosis not present

## 2016-07-29 DIAGNOSIS — I10 Essential (primary) hypertension: Secondary | ICD-10-CM | POA: Diagnosis not present

## 2016-07-29 DIAGNOSIS — Z79818 Long term (current) use of other agents affecting estrogen receptors and estrogen levels: Secondary | ICD-10-CM | POA: Diagnosis not present

## 2016-07-29 DIAGNOSIS — Z79899 Other long term (current) drug therapy: Secondary | ICD-10-CM | POA: Diagnosis not present

## 2016-07-29 DIAGNOSIS — R0789 Other chest pain: Secondary | ICD-10-CM | POA: Diagnosis not present

## 2016-07-29 DIAGNOSIS — J302 Other seasonal allergic rhinitis: Secondary | ICD-10-CM | POA: Diagnosis not present

## 2016-07-29 DIAGNOSIS — Z885 Allergy status to narcotic agent status: Secondary | ICD-10-CM | POA: Diagnosis not present

## 2016-07-29 DIAGNOSIS — E119 Type 2 diabetes mellitus without complications: Secondary | ICD-10-CM | POA: Diagnosis not present

## 2016-07-29 DIAGNOSIS — Z888 Allergy status to other drugs, medicaments and biological substances status: Secondary | ICD-10-CM | POA: Diagnosis not present

## 2016-07-29 MED ORDER — NITROGLYCERIN 0.4 MG SL SUBL: 0.4000 mg | SUBLINGUAL_TABLET | SUBLINGUAL | 12 refills | Status: AC | PRN

## 2016-07-29 NOTE — Progress Notes (Signed)
Progress Note  Patient Name: Cynthia Stewart Date of Encounter: 07/29/2016  Primary Cardiologist: Dr. Eden Emms  Subjective   No chest pain and no SOB cath site without problems  Inpatient Medications    Scheduled Meds: . enoxaparin (LOVENOX) injection  40 mg Subcutaneous Q24H  . losartan  100 mg Oral Daily   And  . hydrochlorothiazide  12.5 mg Oral Daily  . montelukast  10 mg Oral QHS  . rosuvastatin  5 mg Oral Daily  . sodium chloride flush  3 mL Intravenous Q12H   Continuous Infusions: . sodium chloride     PRN Meds: sodium chloride, acetaminophen, azelastine, clonazePAM, loratadine, nitroGLYCERIN, ondansetron (ZOFRAN) IV, sodium chloride flush   Vital Signs    Vitals:   07/28/16 2245 07/28/16 2340 07/29/16 0445 07/29/16 0741  BP: 105/60 110/61 117/60 130/80  Pulse: 74  60   Resp:   16   Temp:   98.1 F (36.7 C)   TempSrc:      SpO2: 95%  97%   Weight:   174 lb 9.6 oz (79.2 kg)   Height:        Intake/Output Summary (Last 24 hours) at 07/29/16 0810 Last data filed at 07/29/16 0446  Gross per 24 hour  Intake           1948.9 ml  Output              350 ml  Net           1598.9 ml   Filed Weights   07/27/16 0612 07/28/16 0356 07/29/16 0445  Weight: 171 lb 14.4 oz (78 kg) 173 lb 3.2 oz (78.6 kg) 174 lb 9.6 oz (79.2 kg)    Telemetry    SR with 5 beats NSVT - Personally Reviewed  ECG    No new since 5/9 - Personally Reviewed  Physical Exam   GEN: No acute distress.   Neck: No JVD Cardiac: RRR, no murmurs, rubs, or gallops. Rt wrist cath site without hematoma.   Respiratory: Clear to auscultation bilaterally. GI: Soft, nontender, non-distended  MS: No edema; No deformity. Neuro:  Nonfocal  Psych: Normal affect   Labs    Chemistry Recent Labs Lab 07/26/16 1700 07/27/16 0631  NA 139 137  K 3.6 3.6  CL 105 102  CO2 25 26  GLUCOSE 112* 128*  BUN 11 13  CREATININE 0.91 1.04*  CALCIUM 9.0 9.0  GFRNONAA >60 56*  GFRAA >60 >60  ANIONGAP  9 9     Hematology Recent Labs Lab 07/26/16 1347 07/26/16 1700  WBC 6.3 6.3  RBC 4.74 4.59  HGB 14.0 13.3  HCT 40.6 39.1  MCV 85.7 85.2  MCH 29.6 29.0  MCHC 34.5 34.0  RDW  --  13.7  PLT  --  215    Cardiac Enzymes Recent Labs Lab 07/26/16 2055 07/27/16 0241 07/27/16 0631  TROPONINI <0.03 <0.03 <0.03    Recent Labs Lab 07/26/16 1709  TROPIPOC 0.00     BNPNo results for input(s): BNP, PROBNP in the last 168 hours.   DDimer  Recent Labs Lab 07/26/16 1700  DDIMER 0.50     Radiology    Nm Myocar Multi W/spect W/wall Motion / Ef  Result Date: 07/27/2016 CLINICAL DATA:  Chest pain. Diabetes (dye control). Hypertension. Hyperlipidemia. EXAM: MYOCARDIAL IMAGING WITH SPECT (REST AND EXERCISE) GATED LEFT VENTRICULAR WALL MOTION STUDY LEFT VENTRICULAR EJECTION FRACTION TECHNIQUE: Standard myocardial SPECT imaging was performed after resting intravenous injection of 10 mCi  Tc-5368m tetrofosmin. Subsequently, exercise tolerance test was performed by the patient under the supervision of the Cardiology staff. At peak-stress, 30 mCi Tc-5068m tetrofosmin was injected intravenously and standard myocardial SPECT imaging was performed. Quantitative gated imaging was also performed to evaluate left ventricular wall motion, and estimate left ventricular ejection fraction. COMPARISON:  None. FINDINGS: Perfusion: Mild severity small size inferolateral area of reduced perfusion on stress images compared to rest images, with 11% reduction in activity compatible with mild inducible ischemia. Wall Motion: Normal left ventricular wall motion. No left ventricular dilation. Left Ventricular Ejection Fraction: 73 % End diastolic volume 58 ml End systolic volume 16 ml IMPRESSION: 1. Small region of mild inducible ischemia in the inferolateral wall. 2. Normal left ventricular wall motion. 3. Left ventricular ejection fraction 73% 4. Non invasive risk stratification*: Low *2012 Appropriate Use Criteria for  Coronary Revascularization Focused Update: J Am Coll Cardiol. 2012;59(9):857-881. http://content.dementiazones.comonlinejacc.org/article.aspx?articleid=1201161 Electronically Signed   By: Gaylyn RongWalter  Liebkemann M.D.   On: 07/27/2016 15:06    Cardiac Studies   nuc see above + ischemia  Cardiac cath 07/28/16 Procedures   Left Heart Cath and Coronary Angiography  Conclusion     Angiographically no coronary artery disease. Extremely tortuous vessels suggestive of long-standing hypertension.  There is hyperdynamic left ventricular systolic function.  The left ventricular ejection fraction is greater than 65% by visual estimate.  LV end diastolic pressure is normal.  There is no mitral valve regurgitation.   Likely false positive nuclear stress test. Recommend aggressive management of hypertension.  Plan:  Cancer back to nursing care unit for TR band removal.  Anticipate based on timing of bedrest that she would probably best discharge tomorrow.      Patient Profile     66 y.o. female has a past medical history significant for diet controled diabetes, essential hypertension, and hyperlipidemia. She was admitted for evaluation of chest pain. Troponins negative, EKG without ischemic changes  Assessment & Plan    Chest pain, neg MI patent coronary arteries.    HTN improved BP needs overall better control may need another agent if elevates once she is at home.   HLD LDL 177 on crestor twice a week and with hx of statin intolerance with no CAD would be difficult to have praluent.    DM-2 stable diet controlled.    Ok for discharge  Follow up with Cardiology PRN  Signed, Nada BoozerLaura Ingold, NP  07/29/2016, 8:10 AM    Patient examined chart reviewed False positive myouve Clean cors non cardiac chest pain Cath sight right radial well healed clear lungs no murmur D/c home today Cath films personally  Reviewed.   Charlton HawsPeter Analei Whinery

## 2016-07-29 NOTE — Plan of Care (Signed)
Problem: Education: Goal: Knowledge of [facility] Education information/materials will improve Outcome: Progressing Patient aware of plan of care.  RN provided instructions on right arm/wrist restrictions and caring for site after cath.  Patient stated understanding.

## 2016-07-29 NOTE — Discharge Instructions (Signed)
Radial Site Care °Refer to this sheet in the next few weeks. These instructions provide you with information about caring for yourself after your procedure. Your health care provider may also give you more specific instructions. Your treatment has been planned according to current medical practices, but problems sometimes occur. Call your health care provider if you have any problems or questions after your procedure. °What can I expect after the procedure? °After your procedure, it is typical to have the following: °· Bruising at the radial site that usually fades within 1-2 weeks. °· Blood collecting in the tissue (hematoma) that may be painful to the touch. It should usually decrease in size and tenderness within 1-2 weeks. °Follow these instructions at home: °· Take medicines only as directed by your health care provider. °· You may shower 24-48 hours after the procedure or as directed by your health care provider. Remove the bandage (dressing) and gently wash the site with plain soap and water. Pat the area dry with a clean towel. Do not rub the site, because this may cause bleeding. °· Do not take baths, swim, or use a hot tub until your health care provider approves. °· Check your insertion site every day for redness, swelling, or drainage. °· Do not apply powder or lotion to the site. °· Do not flex or bend the affected arm for 24 hours or as directed by your health care provider. °· Do not push or pull heavy objects with the affected arm for 24 hours or as directed by your health care provider. °· Do not lift over 10 lb (4.5 kg) for 5 days after your procedure or as directed by your health care provider. °· Ask your health care provider when it is okay to: °¨ Return to work or school. °¨ Resume usual physical activities or sports. °¨ Resume sexual activity. °· Do not drive home if you are discharged the same day as the procedure. Have someone else drive you. °· You may drive 24 hours after the procedure  unless otherwise instructed by your health care provider. °· Do not operate machinery or power tools for 24 hours after the procedure. °· If your procedure was done as an outpatient procedure, which means that you went home the same day as your procedure, a responsible adult should be with you for the first 24 hours after you arrive home. °· Keep all follow-up visits as directed by your health care provider. This is important. °Contact a health care provider if: °· You have a fever. °· You have chills. °· You have increased bleeding from the radial site. Hold pressure on the site. °Get help right away if: °· You have unusual pain at the radial site. °· You have redness, warmth, or swelling at the radial site. °· You have drainage (other than a small amount of blood on the dressing) from the radial site. °· The radial site is bleeding, and the bleeding does not stop after 30 minutes of holding steady pressure on the site. °· Your arm or hand becomes pale, cool, tingly, or numb. °This information is not intended to replace advice given to you by your health care provider. Make sure you discuss any questions you have with your health care provider. °Document Released: 04/09/2010 Document Revised: 08/13/2015 Document Reviewed: 09/23/2013 °Elsevier Interactive Patient Education © 2017 Elsevier Inc. ° °

## 2016-07-29 NOTE — Progress Notes (Signed)
   Subjective: Patient was feeling better when seen during rounds today. She has no complaints.  Objective:  Vital signs in last 24 hours: Vitals:   07/28/16 2245 07/28/16 2340 07/29/16 0445 07/29/16 0741  BP: 105/60 110/61 117/60 130/80  Pulse: 74  60   Resp:   16   Temp:   98.1 F (36.7 C)   TempSrc:      SpO2: 95%  97%   Weight:   174 lb 9.6 oz (79.2 kg)   Height:       Gen.Well-developed lady,Sitting comfortably,in no acute distress. Lungs.Clear bilaterally. CV.Regular rate and rhythm. Abdomen.Soft, nontender, bowel sounds positive. Extremities.No edema, no cyanosis, pulses 2+ bilaterally.  Cardiac Cath. (07/28/16) Procedures   Left Heart Cath and Coronary Angiography  Conclusion     Angiographically no coronary artery disease. Extremely tortuous vessels suggestive of long-standing hypertension.  There is hyperdynamic left ventricular systolic function.  The left ventricular ejection fraction is greater than 65% by visual estimate.  LV end diastolic pressure is normal.  There is no mitral valve regurgitation.   Likely false positive nuclear stress test. Recommend aggressive management of hypertension.      Assessment/Plan:  66 yo woman with PMH hypertension, hld, diabetes mellitus, and anxiety presents from her PCPs office with chest heaviness which began 5/5. The chest heaviness was initially intermittent on Saturday but became worse Sunday and constant Monday.  Chest pain. Most likely related to her current stresses. Cardiac catheterization was normal, does not show any narrowing. Cardiology is recommending better blood pressure control. -Continue Crestor 5 mg biweekly.  Hypertension. Remained normotensive. -Continue her home dose of Hyzaar.  Estrogen replacement therapy.She can work with her PCP to taper off her estrogen.  Type 2 Diabetes mellitus  Last A1c 05/2016 6.9, diet controlled.   Anxiety  - home med Lorazepam PRN   Seasonal  allergies  -continue home med loratadine and montelukast    Dispo: Being discharged today.    Arnetha CourserAmin, Ellsworth Waldschmidt, MD 07/29/2016, 12:48 PM Pager: [phone]

## 2016-08-03 ENCOUNTER — Encounter: Payer: Self-pay | Admitting: Physician Assistant

## 2016-08-03 ENCOUNTER — Ambulatory Visit (INDEPENDENT_AMBULATORY_CARE_PROVIDER_SITE_OTHER): Payer: BLUE CROSS/BLUE SHIELD | Admitting: Physician Assistant

## 2016-08-03 VITALS — BP 157/97 | HR 68 | Temp 98.0°F | Resp 16 | Ht 64.0 in | Wt 176.8 lb

## 2016-08-03 DIAGNOSIS — F430 Acute stress reaction: Secondary | ICD-10-CM | POA: Diagnosis not present

## 2016-08-03 DIAGNOSIS — E7800 Pure hypercholesterolemia, unspecified: Secondary | ICD-10-CM | POA: Diagnosis not present

## 2016-08-03 DIAGNOSIS — I10 Essential (primary) hypertension: Secondary | ICD-10-CM | POA: Diagnosis not present

## 2016-08-03 DIAGNOSIS — N951 Menopausal and female climacteric states: Secondary | ICD-10-CM | POA: Diagnosis not present

## 2016-08-03 MED ORDER — ESTRADIOL 0.0375 MG/24HR TD PTTW: 1.0000 | MEDICATED_PATCH | TRANSDERMAL | 1 refills | Status: DC

## 2016-08-03 MED ORDER — LOSARTAN POTASSIUM-HCTZ 100-25 MG PO TABS: 1.0000 | ORAL_TABLET | Freq: Every day | ORAL | 3 refills | Status: DC

## 2016-08-03 NOTE — Assessment & Plan Note (Addendum)
Given ASCVD risk score, reduce Vivelle dot from 0.05 to 0.0375. If she tolerates the reduction in dose, would then reduce to 0.025 and then try to D/C. Use peppermint oil behind the ears as needed. If ineffective, would consider Black Cohosh or venlafaxine, but she isn't interested in additional medications at this time.

## 2016-08-03 NOTE — Assessment & Plan Note (Signed)
Feeling more depressed. Trying to make a plan for the future. Extend leave from work an additional week. Plan to re-evaluate on 08/12/2016, with RTW on 08/16/16 if able.

## 2016-08-03 NOTE — Patient Instructions (Addendum)
1. Change the losartan-HCTZ 100-12.5 to the 100-25 mg dose. 2. REDUCE the Vivelle dot to the 0.0375 dose. If you tolerate this, we'll reduce to 0.025 at the next visit. 3. Try peppermint oil behind your ears for hot flashes.    IF you received an x-ray today, you will receive an invoice from [facility]. Please contact [facility] at [phone] with questions or concerns regarding your invoice.   IF you received labwork today, you will receive an invoice from [facility]. Please contact [facility] at [phone] with questions or concerns regarding your invoice.   Our billing staff will not be able to assist you with questions regarding bills from these companies.  You will be contacted with the lab results as soon as they are available. The fastest way to get your results is to activate your My Chart account. Instructions are located on the last page of this paperwork. If you have not heard from us regarding the results in 2 weeks, please contact this office.

## 2016-08-03 NOTE — Progress Notes (Signed)
Patient ID: Cynthia Stewart, female    DOB: [birth year], 66 y.o.   MRN: [id number]  PCP: Porfirio OarJeffery, Maryelizabeth Eberle, PA-C  Chief Complaint  Patient presents with  . Follow-up    Pt was admitted to hospital from us - she was having chest pain. Pt states she is doing better and reports no futher chest pain     Subjective:   Presents for Hospital follow-up.  I last saw her for HTN evaluation on 06/14/2016. She'd had some elevated BP readings at work. She was switched from valsartan 320 and HCTZ 12.5 (due to cost of valsartan) to losartan 100-12.5. A1C was 6.9%, diet controlled.  She was experiencing considerable stress at work. Her manager and a colleague are being bullies. She has discovered that the manager's daughter works in [place], which may explain why the patient's complaints to HR have not resulted in any kind of discipline or change of behavior. She is hoping to work for 1 more year, until she qualifies for Medicare, but isn't sure that she can stay at her current job. She could transfer back to [place], or to the office in [place], [place], but can't afford two residences and doesn't want to move again.  She then presented here on 07/26/16 with chest pain and was transferred to the ED, where she was admitted for additional evaluation. She notes that the pain started at work and Engineer, sitethe manager followed her to the care, not out of care for her welfare, but to antagonize her. EKGs, D-dimer and troponins were normal/negative. Inpatient stress testing performed due to her risk factors, which revealed reversible inferolateral ischemia. Catheterization revealed no obstruction, but was notable for tortuous coronary vessels, consistent with longstanding HTN. It was determined that the pain was noncardiogenic and she was discharged 07/29/2016. Due to her risk factors, she was advised to increase rosuvastatin from twice weekly to daily, and encouraged to work with me to reduce and stop the estrogen.  Discharge BP was  130/80. She has had no CP, SOB, palpitations. Home BP 150-160/80-90.  Review of Systems  Constitutional: Negative.   HENT: Negative for sore throat.   Eyes: Negative for visual disturbance.  Respiratory: Negative for cough, chest tightness, shortness of breath and wheezing.   Cardiovascular: Negative for chest pain and palpitations.  Gastrointestinal: Negative for abdominal pain, diarrhea, nausea and vomiting.  Genitourinary: Negative for dysuria, frequency, hematuria and urgency.       Tolerable hot flashes, at any time day or night  Musculoskeletal: Positive for myalgias (mild, since increasing rosuvastatin from twice weekly to QD). Negative for arthralgias.  Skin: Negative for rash.  Neurological: Negative for dizziness, weakness and headaches.  Psychiatric/Behavioral: Positive for dysphoric mood. Negative for decreased concentration. The patient is nervous/anxious.        Patient Active Problem List   Diagnosis Date Noted  . Abnormal nuclear stress test   . PAF (paroxysmal atrial fibrillation) (HCC) 07/27/2016  . Chest pain 07/26/2016  . Vitamin D deficiency 06/14/2016  . BMI 30.0-30.9,adult 09/10/2015  . Diabetes mellitus type 2, controlled, without complications (HCC) 02/07/2014  . Elevated LDL cholesterol level 02/07/2014  . Environmental and seasonal allergies 02/07/2014  . Essential hypertension 01/15/2014  . DD (diverticular disease) 12/27/2011  . Hot flash, menopausal 11/23/2010  . Acute stress disorder 11/23/2010     Prior to Admission medications   Medication Sig Start Date End Date Taking? Authorizing Provider  azelastine (ASTELIN) 0.1 % nasal spray Place 2 sprays into both nostrils 2 (two)  times daily. Use in each nostril as directed 02/09/16  Yes Makhiya Coburn, PA-C  clonazePAM (KLONOPIN) 0.5 MG tablet Take 1 tablet (0.5 mg total) by mouth 2 (two) times daily as needed for anxiety. Patient taking differently: Take 0.5 mg by mouth daily as needed for  anxiety.  06/14/16  Yes Farooq Petrovich, PA-C  estradiol (VIVELLE-DOT) 0.05 MG/24HR patch APPLY 1 PATCH(0.05 MG) EXTERNALLY TO THE SKIN 2 TIMES A WEEK 06/28/16  Yes Raelie Lohr, PA-C  fexofenadine (ALLEGRA) 180 MG tablet Take 180 mg by mouth daily.   Yes [provider]  losartan-hydrochlorothiazide (HYZAAR) 100-12.5 MG tablet Take 1 tablet by mouth daily. 06/14/16  Yes Moorea Boissonneault, PA-C  montelukast (SINGULAIR) 10 MG tablet Take 1 tablet (10 mg total) by mouth at bedtime. 02/09/16  Yes Charice Zuno, PA-C  nitroGLYCERIN (NITROSTAT) 0.4 MG SL tablet Place 1 tablet (0.4 mg total) under the tongue every 5 (five) minutes as needed for chest pain. 07/29/16  Yes Arnetha Courser, MD  rosuvastatin (CRESTOR) 5 MG tablet Take 1 tablet (5 mg total) by mouth daily. 02/09/16  Yes Porfirio Oar, PA-C     Allergies  Allergen Reactions  . Codeine Other (See Comments)    HALLUCINATION  . Percocet [Oxycodone-Acetaminophen] Itching and Other (See Comments)    HALLUCINATION  . Trileptal [Oxcarbazepine]        Objective:  Physical Exam  Constitutional: She is oriented to person, place, and time. She appears well-developed and well-nourished. She is active and cooperative. No distress.  BP (!) 157/97   Pulse 68   Temp 98 F (36.7 C) (Oral)   Resp 16   Ht 5\' 4"  (1.626 m)   Wt 176 lb 12.8 oz (80.2 kg)   SpO2 97%   BMI 30.35 kg/m    Eyes: Conjunctivae are normal.  Pulmonary/Chest: Effort normal.  Neurological: She is alert and oriented to person, place, and time.  Psychiatric: Her speech is normal and behavior is normal. Judgment and thought content normal. Her mood appears anxious. Her affect is labile. Her affect is not angry, not blunt and not inappropriate. Cognition and memory are normal. She exhibits a depressed mood.       Assessment & Plan:   Problem List Items Addressed This Visit    Essential hypertension - Primary    Not well controlled. INCREASE losartanHCT from  100-12.5 to 100-25 mg and recheck in 10 days.      Relevant Medications   losartan-hydrochlorothiazide (HYZAAR) 100-25 MG tablet   Elevated LDL cholesterol level    Continue rosuvastatin QD. If muscle aches worsen, she will let me know.      Hot flash, menopausal    Given ASCVD risk score, reduce Vivelle dot from 0.05 to 0.0375. If she tolerates the reduction in dose, would then reduce to 0.025 and then try to D/C. Use peppermint oil behind the ears as needed. If ineffective, would consider Black Cohosh or venlafaxine, but she isn't interested in additional medications at this time.       Relevant Medications   estradiol (VIVELLE-DOT) 0.0375 MG/24HR (Start on 08/04/2016)   Acute stress disorder    Feeling more depressed. Trying to make a plan for the future. Extend leave from work an additional week. Plan to re-evaluate on 08/12/2016, with RTW on 08/16/16 if able.          Return in about 9 days (around 08/12/2016) for re-evaluation of blood pressure and acute stress reaction.   Fernande Bras, PA-C Primary Care  at [facility]

## 2016-08-03 NOTE — Assessment & Plan Note (Signed)
Continue rosuvastatin QD. If muscle aches worsen, she will let me know.

## 2016-08-03 NOTE — Assessment & Plan Note (Signed)
Not well controlled. INCREASE losartanHCT from 100-12.5 to 100-25 mg and recheck in 10 days.

## 2016-08-04 ENCOUNTER — Telehealth: Payer: Self-pay | Admitting: Physician Assistant

## 2016-08-04 NOTE — Telephone Encounter (Signed)
Patient needs FMLA forms completed for her high BP and chest pain, I have completed what I could from the OV notes and also to included her time in the hospital. I will place the forms in Chelle's box on 08/04/16 if you could please return them to the FMLA/Disability box at the [employer] checkout desk within 5-7 business days. Thank you!

## 2016-08-05 NOTE — Telephone Encounter (Signed)
Completed forms. Return to FMLA/Disability desk.

## 2016-08-05 NOTE — Telephone Encounter (Signed)
Paperwork scanned and faxed on 08/05/16

## 2016-08-10 ENCOUNTER — Telehealth: Payer: Self-pay | Admitting: Physician Assistant

## 2016-08-10 NOTE — Telephone Encounter (Signed)
Patient needs provider statement completed by Chelle for her disability claim. I have highlighted what needs to be finished I will place the forms in your box on 08/10/16 please return it to the FMLA/Disability box at the [employer] checkout desk within 5-7 business days. Thank you!

## 2016-08-10 NOTE — Telephone Encounter (Signed)
Form completed and returned as per instructions.

## 2016-08-12 ENCOUNTER — Encounter: Payer: Self-pay | Admitting: Physician Assistant

## 2016-08-12 ENCOUNTER — Ambulatory Visit (INDEPENDENT_AMBULATORY_CARE_PROVIDER_SITE_OTHER): Payer: BLUE CROSS/BLUE SHIELD | Admitting: Physician Assistant

## 2016-08-12 VITALS — BP 138/80 | HR 65 | Temp 97.9°F | Resp 18 | Ht 64.13 in | Wt 176.2 lb

## 2016-08-12 DIAGNOSIS — F430 Acute stress reaction: Secondary | ICD-10-CM

## 2016-08-12 DIAGNOSIS — I10 Essential (primary) hypertension: Secondary | ICD-10-CM
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
## 2016-08-12 NOTE — Assessment & Plan Note (Signed)
Controlled. Continue current regimen. Anticipate additional improvement with leaving her job, as the stress associated with her current work is increasing her BP.

## 2016-08-12 NOTE — Assessment & Plan Note (Signed)
Remain out of work. Reassess in 2 weeks. In the interim, continue looking for alternative employment.

## 2016-08-12 NOTE — Patient Instructions (Signed)
     IF you received an x-ray today, you will receive an invoice from [facility]. Please contact [facility] at [phone] with questions or concerns regarding your invoice.   IF you received labwork today, you will receive an invoice from [facility]. Please contact [facility] at [phone] with questions or concerns regarding your invoice.   Our billing staff will not be able to assist you with questions regarding bills from these companies.  You will be contacted with the lab results as soon as they are available. The fastest way to get your results is to activate your My Chart account. Instructions are located on the last page of this paperwork. If you have not heard from us regarding the results in 2 weeks, please contact this office.     

## 2016-08-16 NOTE — Telephone Encounter (Signed)
Paperwork scanned and faxed to [facility] on 08/16/16

## 2016-08-26 ENCOUNTER — Encounter: Payer: Self-pay | Admitting: Physician Assistant

## 2016-08-26 ENCOUNTER — Ambulatory Visit (INDEPENDENT_AMBULATORY_CARE_PROVIDER_SITE_OTHER): Payer: BLUE CROSS/BLUE SHIELD | Admitting: Physician Assistant

## 2016-08-26 VITALS — BP 138/82 | HR 61 | Temp 97.4°F | Resp 18 | Ht 64.13 in | Wt 175.2 lb

## 2016-08-26 DIAGNOSIS — F430 Acute stress reaction: Secondary | ICD-10-CM | POA: Diagnosis not present

## 2016-08-26 DIAGNOSIS — I480 Paroxysmal atrial fibrillation: Secondary | ICD-10-CM

## 2016-08-26 DIAGNOSIS — I10 Essential (primary) hypertension: Secondary | ICD-10-CM | POA: Diagnosis not present

## 2016-08-26 MED ORDER — CLONAZEPAM 0.5 MG PO TABS: 0.5000 mg | ORAL_TABLET | Freq: Two times a day (BID) | ORAL | 0 refills | Status: DC | PRN

## 2016-08-26 NOTE — Patient Instructions (Signed)
     IF you received an x-ray today, you will receive an invoice from [facility]. Please contact [facility] at [phone] with questions or concerns regarding your invoice.   IF you received labwork today, you will receive an invoice from [facility]. Please contact [facility] at [phone] with questions or concerns regarding your invoice.   Our billing staff will not be able to assist you with questions regarding bills from these companies.  You will be contacted with the lab results as soon as they are available. The fastest way to get your results is to activate your My Chart account. Instructions are located on the last page of this paperwork. If you have not heard from us regarding the results in 2 weeks, please contact this office.     
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
## 2016-09-08 DIAGNOSIS — Z0271 Encounter for disability determination: Secondary | ICD-10-CM

## 2016-09-15 ENCOUNTER — Telehealth: Payer: Self-pay | Admitting: Physician Assistant

## 2016-09-15 NOTE — Telephone Encounter (Signed)
Pt states that the new dosage of the losartan-hydrochlorothiazide (HYZAAR) 100-25   is not keeping her pressure down.  Contact number [phone]

## 2016-09-16 MED ORDER — AMLODIPINE BESYLATE 5 MG PO TABS: 5.0000 mg | ORAL_TABLET | Freq: Every day | ORAL | 3 refills | Status: DC

## 2016-09-16 NOTE — Telephone Encounter (Signed)
She has a appointment of 09/27/16. Pls advise.

## 2016-09-16 NOTE — Telephone Encounter (Signed)
Continue losartanHCT 100-25. ADD amlodipine 5 mg, one daily. She can take it with the other, or at a separate time.  Meds ordered this encounter  Medications  . amLODipine (NORVASC) 5 MG tablet    Sig: Take 1 tablet (5 mg total) by mouth daily.    Dispense:  90 tablet    Refill:  3    Order Specific Question:   Supervising Provider    Answer:   Clelia CroftSHAW, EVA N [[id number]]

## 2016-09-21 NOTE — Assessment & Plan Note (Signed)
Regular today

## 2016-09-21 NOTE — Assessment & Plan Note (Signed)
Controlled. Exacerbated by work stress.

## 2016-09-21 NOTE — Assessment & Plan Note (Signed)
She will RTW on 6/11, as planned. Anticipate that she will be terminated or resign within the week.

## 2016-09-22 ENCOUNTER — Telehealth: Payer: Self-pay

## 2016-09-22 NOTE — Telephone Encounter (Signed)
Spoke with pt and pt states she has started the meds and will continue with follow up appt on 11/29/16.

## 2016-09-22 NOTE — Telephone Encounter (Signed)
Called pt per Chelle Continue losartanHCT 100-25. ADD amlodipine 5 mg, one daily. She can take it with the other, or at a separate time.

## 2016-09-27 ENCOUNTER — Ambulatory Visit: Payer: BLUE CROSS/BLUE SHIELD | Admitting: Physician Assistant

## 2016-09-29 ENCOUNTER — Other Ambulatory Visit: Payer: Self-pay | Admitting: Physician Assistant

## 2016-09-29 DIAGNOSIS — F430 Acute stress reaction: Secondary | ICD-10-CM

## 2016-10-01 NOTE — Telephone Encounter (Signed)
Meds ordered this encounter  Medications  . clonazePAM (KLONOPIN) 0.5 MG tablet    Sig: TAKE 1 TABLET TWICE A DAY AS NEEDED FOR ANXIETY    Dispense:  60 tablet    Refill:  0    Not to exceed 5 additional fills before 02/22/2017.

## 2016-10-03 ENCOUNTER — Telehealth: Payer: Self-pay

## 2016-10-13 ENCOUNTER — Other Ambulatory Visit: Payer: Self-pay | Admitting: Physician Assistant

## 2016-10-13 DIAGNOSIS — N951 Menopausal and female climacteric states: Secondary | ICD-10-CM

## 2016-10-14 NOTE — Telephone Encounter (Signed)
Pharmacy req refill on Estradiol Patch Per your note on 08/03/16, would talked about maybe reducing med to 0.025mg   Please advise/refill

## 2016-11-05 ENCOUNTER — Encounter: Payer: Self-pay | Admitting: Physician Assistant

## 2016-11-05 ENCOUNTER — Ambulatory Visit (INDEPENDENT_AMBULATORY_CARE_PROVIDER_SITE_OTHER): Payer: BLUE CROSS/BLUE SHIELD | Admitting: Physician Assistant

## 2016-11-05 VITALS — BP 125/76 | HR 73 | Temp 97.9°F | Resp 18 | Ht 64.13 in | Wt 176.0 lb

## 2016-11-05 DIAGNOSIS — F430 Acute stress reaction: Secondary | ICD-10-CM | POA: Diagnosis not present

## 2016-11-05 DIAGNOSIS — E119 Type 2 diabetes mellitus without complications: Secondary | ICD-10-CM

## 2016-11-05 DIAGNOSIS — E7800 Pure hypercholesterolemia, unspecified: Secondary | ICD-10-CM | POA: Diagnosis not present

## 2016-11-05 DIAGNOSIS — E559 Vitamin D deficiency, unspecified: Secondary | ICD-10-CM

## 2016-11-05 DIAGNOSIS — I10 Essential (primary) hypertension: Secondary | ICD-10-CM | POA: Diagnosis not present

## 2016-11-05 DIAGNOSIS — J019 Acute sinusitis, unspecified: Secondary | ICD-10-CM | POA: Diagnosis not present

## 2016-11-05 MED ORDER — CLONAZEPAM 0.5 MG PO TABS: ORAL_TABLET | ORAL | 0 refills | Status: AC

## 2016-11-05 MED ORDER — AMOXICILLIN-POT CLAVULANATE 875-125 MG PO TABS: 1.0000 | ORAL_TABLET | Freq: Two times a day (BID) | ORAL | 0 refills | Status: AC

## 2016-11-05 NOTE — Patient Instructions (Addendum)
Monitor your blood pressure. If it's consistently >140/90, let me know and we'll add another product to help it.    IF you received an x-ray today, you will receive an invoice from [facility]. Please contact [facility] at [phone] with questions or concerns regarding your invoice.   IF you received labwork today, you will receive an invoice from [facility]. Please contact [facility] at [phone] with questions or concerns regarding your invoice.   Our billing staff will not be able to assist you with questions regarding bills from these companies.  You will be contacted with the lab results as soon as they are available. The fastest way to get your results is to activate your My Chart account. Instructions are located on the last page of this paperwork. If you have not heard from [place] regarding the results in 2 weeks, please contact this office.

## 2016-11-05 NOTE — Progress Notes (Signed)
Patient ID: Cynthia Stewart, female    DOB: 10/29/1950, 66 y.o.   MRN: [id number]  PCP: Porfirio Oar, PA-C  Chief Complaint  Patient presents with  . Sinusitis    Facial pain, ear fullness, headache  . Nasal Congestion  . Medication Problem    Amlodioine, Causes chest pain with breathing issues. Has not taken in 1 week    Subjective:   Presents for evaluation of sinusitis, and to discuss amlodipine.  She has longstanding environmental allergies, which have been worse x several weeks. She describes facial pain and headache, ear fullness and congestion. Azelastine and montelukast haven't been effective.  In addition, she relates chest pain and dyspnea with the amlodipine. She stopped it a week ago, with complete resolution of the associated symptoms. She has had no recurrent chest pain or difficulty breathing.  She has left her job. After lots of consideration, she decided that the negative impact on her health was intolerable. She is taking several weeks to decide what to do next. Her husband is very supportive.    Review of Systems As above. No chest pain, SOB, HA, dizziness, vision change, N/V, diarrhea, constipation, dysuria, urinary urgency or frequency, myalgias, arthralgias or rash.   Patient Active Problem List   Diagnosis Date Noted  . Abnormal nuclear stress test   . PAF (paroxysmal atrial fibrillation) (HCC) 07/27/2016  . Chest pain 07/26/2016  . Vitamin D deficiency 06/14/2016  . BMI 30.0-30.9,adult 09/10/2015  . Diabetes mellitus type 2, controlled, without complications (HCC) 02/07/2014  . Elevated LDL cholesterol level 02/07/2014  . Environmental and seasonal allergies 02/07/2014  . Essential hypertension 01/15/2014  . DD (diverticular disease) 12/27/2011  . Hot flash, menopausal 11/23/2010  . Acute stress disorder 11/23/2010     Prior to Admission medications   Medication Sig Start Date End Date Taking? Authorizing Provider  azelastine (ASTELIN)  0.1 % nasal spray Place 2 sprays into both nostrils 2 (two) times daily. Use in each nostril as directed 02/09/16  Yes Marley Pakula, PA-C  clonazePAM (KLONOPIN) 0.5 MG tablet TAKE 1 TABLET TWICE A DAY AS NEEDED FOR ANXIETY 10/01/16  Yes Raechelle Sarti, PA-C  estradiol (VIVELLE-DOT) 0.0375 MG/24HR PLACE 1 PATCH ONTO THE SKIN 2 (TWO) TIMES A WEEK. 10/17/16  Yes Denajah Farias, PA-C  fexofenadine (ALLEGRA) 180 MG tablet Take 180 mg by mouth daily.   Yes [provider]  losartan-hydrochlorothiazide (HYZAAR) 100-25 MG tablet Take 1 tablet by mouth daily. 08/03/16  Yes Xolani Degracia, PA-C  montelukast (SINGULAIR) 10 MG tablet Take 1 tablet (10 mg total) by mouth at bedtime. 02/09/16  Yes Balinda Heacock, PA-C  rosuvastatin (CRESTOR) 5 MG tablet Take 1 tablet (5 mg total) by mouth daily. 02/09/16  Yes Jacobb Alen, PA-C  nitroGLYCERIN (NITROSTAT) 0.4 MG SL tablet Place 1 tablet (0.4 mg total) under the tongue every 5 (five) minutes as needed for chest pain. Patient not taking: Reported on 11/05/2016 07/29/16   Arnetha Courser, MD     Allergies  Allergen Reactions  . Amlodipine   . Codeine Other (See Comments)    HALLUCINATION  . Percocet [Oxycodone-Acetaminophen] Itching and Other (See Comments)    HALLUCINATION  . Trileptal [Oxcarbazepine]        Objective:  Physical Exam  Constitutional: She is oriented to person, place, and time. She appears well-developed and well-nourished. She is active and cooperative. No distress.  BP 125/76   Pulse 73   Temp 97.9 F (36.6 C) (Oral)   Resp 18  Ht 5' 4.13" (1.629 m)   Wt 176 lb (79.8 kg)   SpO2 100%   BMI 30.09 kg/m   HENT:  Head: Normocephalic and atraumatic.  Right Ear: Hearing, tympanic membrane, external ear and ear canal normal.  Left Ear: Hearing, tympanic membrane, external ear and ear canal normal.  Nose: Mucosal edema and rhinorrhea present. Right sinus exhibits maxillary sinus tenderness and frontal sinus tenderness.  Left sinus exhibits maxillary sinus tenderness and frontal sinus tenderness.  Mouth/Throat: Uvula is midline, oropharynx is clear and moist and mucous membranes are normal. No oral lesions. No uvula swelling.  Eyes: Conjunctivae are normal. No scleral icterus.  Neck: Normal range of motion, full passive range of motion without pain and phonation normal. Neck supple. No thyromegaly present.  Cardiovascular: Normal rate, regular rhythm and normal heart sounds.   Pulses:      Radial pulses are 2+ on the right side, and 2+ on the left side.  Pulmonary/Chest: Effort normal and breath sounds normal.  Lymphadenopathy:       Head (right side): No tonsillar, no preauricular, no posterior auricular and no occipital adenopathy present.       Head (left side): No tonsillar, no preauricular, no posterior auricular and no occipital adenopathy present.    She has no cervical adenopathy.       Right: No supraclavicular adenopathy present.       Left: No supraclavicular adenopathy present.  Neurological: She is alert and oriented to person, place, and time. No sensory deficit.  Skin: Skin is warm, dry and intact. No rash noted. No cyanosis or erythema. Nails show no clubbing.  Psychiatric: She has a normal mood and affect. Her speech is normal and behavior is normal.        Assessment & Plan:   Problem List Items Addressed This Visit    Essential hypertension    Controlled. Continue off amlodipine, due to adverse effects. Monitor at home and let me know if consistently >140/90.      Relevant Medications   rosuvastatin (CRESTOR) 10 MG tablet   Other Relevant Orders   CBC with Differential/Platelet (Completed)   Comprehensive metabolic panel (Completed)   TSH (Completed)   Diabetes mellitus type 2, controlled, without complications (HCC)    Tolerating metformin. Await labs. Adjust regimen as indicated by results.       Relevant Medications   rosuvastatin (CRESTOR) 10 MG tablet   metFORMIN  (GLUCOPHAGE-XR) 500 MG 24 hr tablet   Other Relevant Orders   Comprehensive metabolic panel (Completed)   Hemoglobin A1c (Completed)   Microalbumin, urine (Completed)   Elevated LDL cholesterol level    Goal LDL <70%. Await labs. Adjust regimen as indicated by results.      Relevant Medications   rosuvastatin (CRESTOR) 10 MG tablet   Other Relevant Orders   Comprehensive metabolic panel (Completed)   Lipid panel (Completed)   Acute stress disorder    Much improved since leaving her job.       Relevant Medications   clonazePAM (KLONOPIN) 0.5 MG tablet   Vitamin D deficiency    Await labs. Adjust regimen as indicated by results.       Relevant Medications   Vitamin D, Ergocalciferol, (DRISDOL) 50000 units CAPS capsule   Other Relevant Orders   VITAMIN D 25 Hydroxy (Vit-D Deficiency, Fractures) (Completed)    Other Visit Diagnoses    Acute non-recurrent sinusitis, unspecified location    -  Primary   Add Augmentin BID x 10 days.  Continue azelastine and montelukast. Supportive care.       Return in about 3 months (around 02/05/2017) for re-evaluation of blood pressure, diabetes, cholesterol.   Fernande Bras, PA-C Primary Care at [facility]

## 2016-11-06 LAB — MICROALBUMIN, URINE: Microalbumin, Urine: <3 ug/mL

## 2016-11-07 LAB — CBC WITH DIFFERENTIAL/PLATELET
Basophils Absolute: 0 10*3/uL (ref 0.0–0.2)
Basos: 0 %
EOS (ABSOLUTE): 0.3 10*3/uL (ref 0.0–0.4)
EOS: 4 %
HEMATOCRIT: 38.8 % (ref 34.0–46.6)
HEMOGLOBIN: 13.2 g/dL (ref 11.1–15.9)
IMMATURE GRANULOCYTES: 0 %
Immature Grans (Abs): 0 10*3/uL (ref 0.0–0.1)
Lymphocytes Absolute: 3.1 10*3/uL (ref 0.7–3.1)
Lymphs: 48 %
MCH: 29.1 pg (ref 26.6–33.0)
MCHC: 34 g/dL (ref 31.5–35.7)
MCV: 86 fL (ref 79–97)
MONOCYTES: 10 %
Monocytes Absolute: 0.6 10*3/uL (ref 0.1–0.9)
NEUTROS PCT: 38 %
Neutrophils Absolute: 2.4 10*3/uL (ref 1.4–7.0)
Platelets: 221 10*3/uL (ref 150–379)
RBC: 4.53 x10E6/uL (ref 3.77–5.28)
RDW: 13.9 % (ref 12.3–15.4)
WBC: 6.4 10*3/uL (ref 3.4–10.8)

## 2016-11-07 LAB — COMPREHENSIVE METABOLIC PANEL
A/G RATIO: 1.6 (ref 1.2–2.2)
ALBUMIN: 4.4 g/dL (ref 3.6–4.8)
ALT: 16 IU/L (ref 0–32)
AST: 25 IU/L (ref 0–40)
Alkaline Phosphatase: 64 IU/L (ref 39–117)
BUN / CREAT RATIO: 15 (ref 12–28)
BUN: 15 mg/dL (ref 8–27)
Bilirubin Total: 0.5 mg/dL (ref 0.0–1.2)
CALCIUM: 9.7 mg/dL (ref 8.7–10.3)
CO2: 27 mmol/L (ref 20–29)
CREATININE: 0.98 mg/dL (ref 0.57–1.00)
Chloride: 103 mmol/L (ref 96–106)
GFR calc Af Amer: 71 mL/min/{1.73_m2} (ref >59)
GFR calc non Af Amer: 61 mL/min/{1.73_m2} (ref >59)
GLOBULIN, TOTAL: 2.8 g/dL (ref 1.5–4.5)
Glucose: 88 mg/dL (ref 65–99)
Potassium: 3.9 mmol/L (ref 3.5–5.2)
Sodium: 143 mmol/L (ref 134–144)
Total Protein: 7.2 g/dL (ref 6.0–8.5)

## 2016-11-07 LAB — HEMOGLOBIN A1C
ESTIMATED AVERAGE GLUCOSE: 154 mg/dL
Hgb A1c MFr Bld: 7 % — ABNORMAL HIGH (ref 4.8–5.6)

## 2016-11-07 LAB — LIPID PANEL
CHOL/HDL RATIO: 3 ratio (ref 0.0–4.4)
Cholesterol, Total: 222 mg/dL — ABNORMAL HIGH (ref 100–199)
HDL: 75 mg/dL (ref >39)
LDL Calculated: 122 mg/dL — ABNORMAL HIGH (ref 0–99)
TRIGLYCERIDES: 126 mg/dL (ref 0–149)
VLDL Cholesterol Cal: 25 mg/dL (ref 5–40)

## 2016-11-07 LAB — VITAMIN D 25 HYDROXY (VIT D DEFICIENCY, FRACTURES): VIT D 25 HYDROXY: 17 ng/mL — AB (ref 30.0–100.0)

## 2016-11-07 LAB — TSH: TSH: 0.657 u[IU]/mL (ref 0.450–4.500)

## 2016-11-16 MED ORDER — METFORMIN HCL ER 500 MG PO TB24: 500.0000 mg | ORAL_TABLET | Freq: Every day | ORAL | 3 refills | Status: DC

## 2016-11-16 MED ORDER — ROSUVASTATIN CALCIUM 10 MG PO TABS: 10.0000 mg | ORAL_TABLET | Freq: Every day | ORAL | 3 refills | Status: DC

## 2016-11-16 MED ORDER — VITAMIN D (ERGOCALCIFEROL) 1.25 MG (50000 UNIT) PO CAPS: 50000.0000 [IU] | ORAL_CAPSULE | ORAL | 3 refills | Status: DC

## 2016-11-23 NOTE — Assessment & Plan Note (Signed)
Controlled. Continue off amlodipine, due to adverse effects. Monitor at home and let me know if consistently >140/90.

## 2016-11-23 NOTE — Assessment & Plan Note (Signed)
Await labs. Adjust regimen as indicated by results.  

## 2016-11-23 NOTE — Assessment & Plan Note (Signed)
Goal LDL <70. Await labs. Adjust regimen as indicated by results.  

## 2016-11-23 NOTE — Assessment & Plan Note (Signed)
Much improved since leaving her job.

## 2016-11-23 NOTE — Assessment & Plan Note (Signed)
Tolerating metformin. Await labs. Adjust regimen as indicated by results.

## 2016-11-29 ENCOUNTER — Ambulatory Visit: Payer: BLUE CROSS/BLUE SHIELD | Admitting: Physician Assistant

## 2016-12-01 ENCOUNTER — Other Ambulatory Visit: Payer: Self-pay | Admitting: Physician Assistant

## 2016-12-01 DIAGNOSIS — J302 Other seasonal allergic rhinitis: Secondary | ICD-10-CM

## 2016-12-05 ENCOUNTER — Other Ambulatory Visit: Payer: Self-pay | Admitting: *Deleted

## 2016-12-05 DIAGNOSIS — E7800 Pure hypercholesterolemia, unspecified: Secondary | ICD-10-CM

## 2016-12-05 DIAGNOSIS — E119 Type 2 diabetes mellitus without complications: Secondary | ICD-10-CM

## 2016-12-05 DIAGNOSIS — E559 Vitamin D deficiency, unspecified: Secondary | ICD-10-CM

## 2016-12-05 MED ORDER — PREDNISONE 20 MG PO TABS: ORAL_TABLET | ORAL | 0 refills | Status: DC

## 2016-12-05 MED ORDER — ROSUVASTATIN CALCIUM 10 MG PO TABS: 10.0000 mg | ORAL_TABLET | Freq: Every day | ORAL | 3 refills | Status: AC

## 2016-12-05 MED ORDER — VITAMIN D (ERGOCALCIFEROL) 1.25 MG (50000 UNIT) PO CAPS: 50000.0000 [IU] | ORAL_CAPSULE | ORAL | 3 refills | Status: AC

## 2016-12-05 MED ORDER — METFORMIN HCL ER 500 MG PO TB24: 500.0000 mg | ORAL_TABLET | Freq: Every day | ORAL | 3 refills | Status: AC

## 2016-12-05 NOTE — Addendum Note (Signed)
Addended by: Fernande Bras on: 12/05/2016 02:08 PM   Modules accepted: Orders

## 2016-12-06 ENCOUNTER — Other Ambulatory Visit: Payer: Self-pay | Admitting: *Deleted

## 2016-12-06 DIAGNOSIS — R0989 Other specified symptoms and signs involving the circulatory and respiratory systems: Secondary | ICD-10-CM

## 2016-12-06 MED ORDER — PREDNISONE 20 MG PO TABS: ORAL_TABLET | ORAL | 0 refills | Status: AC

## 2016-12-10 ENCOUNTER — Other Ambulatory Visit: Payer: Self-pay | Admitting: Physician Assistant

## 2016-12-10 DIAGNOSIS — N951 Menopausal and female climacteric states: Secondary | ICD-10-CM

## 2017-03-16 NOTE — Telephone Encounter (Signed)
Error-Sign Encounter 

## 2017-06-26 ENCOUNTER — Encounter: Payer: Self-pay | Admitting: Physician Assistant

## 2017-07-11 IMAGING — DX DG CHEST 2V
2 series · 2 of 2 positions shown · non-contrast
Comparison: None.

CLINICAL DATA: Shortness of breath and chest pain for several days

EXAM:
CHEST  2 VIEW

[w chest pa]
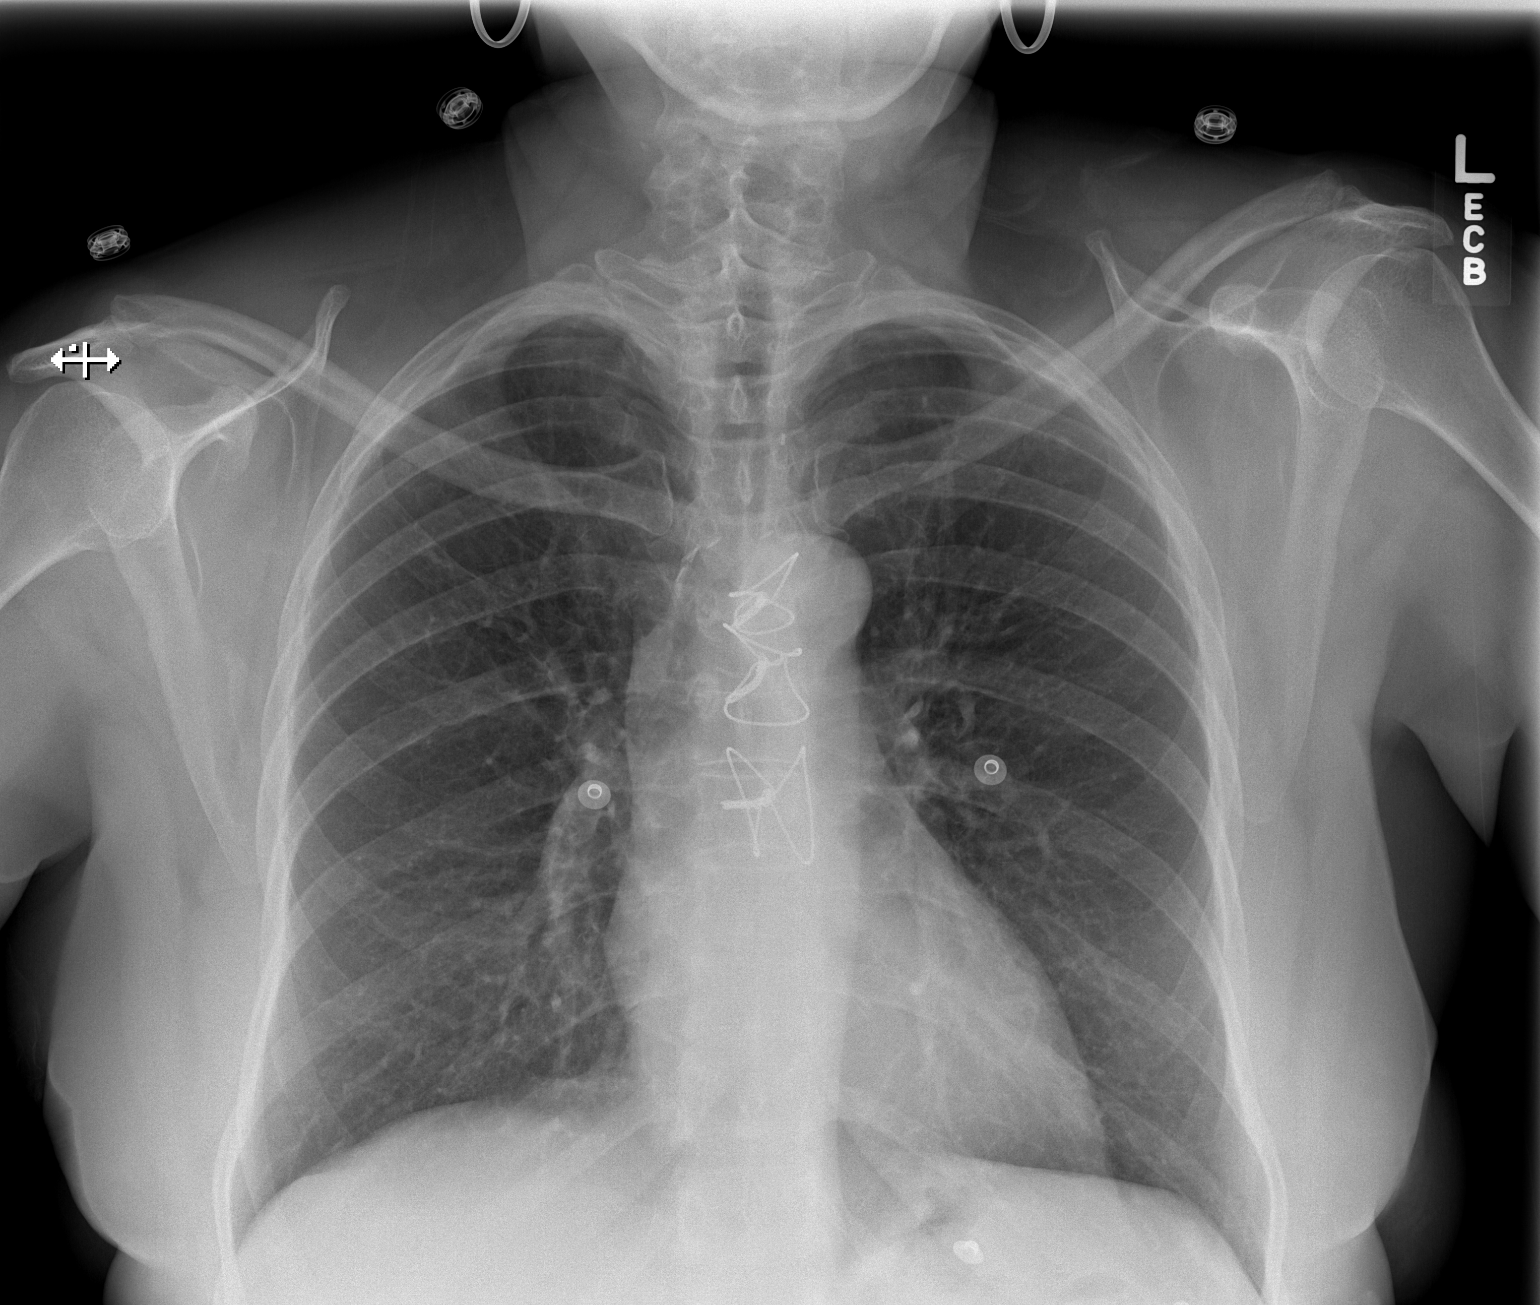

[w chest lat]
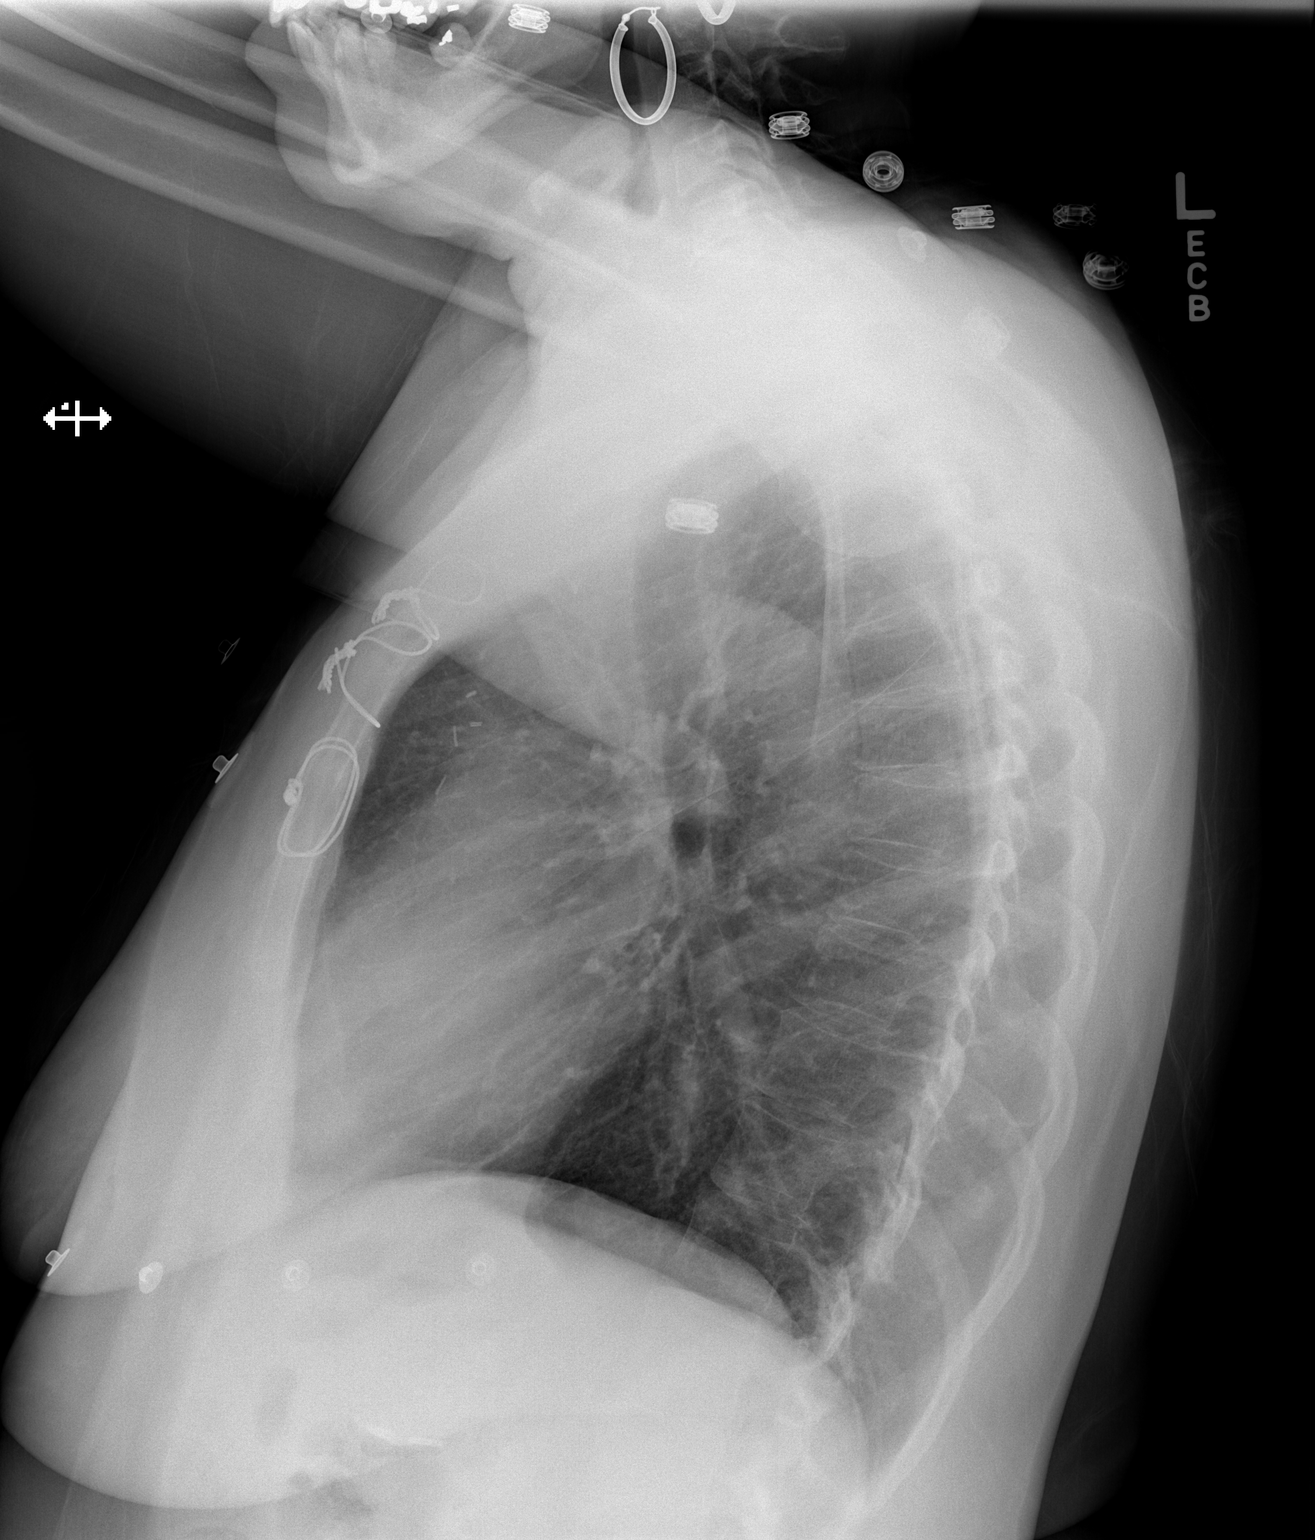

[2 of 2 positions shown; findings below may reference images not displayed]

FINDINGS: Changes consistent with prior median sternotomy are noted. Cardiac
shadow is within normal limits. The lungs are clear. No other bony
abnormality is seen.
IMPRESSION: No active cardiopulmonary disease.

## 2017-07-16 ENCOUNTER — Other Ambulatory Visit: Payer: Self-pay | Admitting: Physician Assistant

## 2017-07-16 DIAGNOSIS — I10 Essential (primary) hypertension: Secondary | ICD-10-CM

## 2017-09-01 ENCOUNTER — Other Ambulatory Visit: Payer: Self-pay | Admitting: Physician Assistant

## 2017-09-01 DIAGNOSIS — I10 Essential (primary) hypertension: Secondary | ICD-10-CM

## 2017-09-01 NOTE — Telephone Encounter (Signed)
Hyzaar 100-25 mg refill request  LOV 11/05/16 with Chelle Jeffery  Needs appt.  [facility] - [place], [place] - [address]

## 2017-09-16 ENCOUNTER — Emergency Department (HOSPITAL_COMMUNITY)
Admission: EM | Admit: 2017-09-16 | Discharge: 2017-09-16 | Disposition: A | Discharge status: Home / Self Care | Payer: BLUE CROSS/BLUE SHIELD | Attending: Emergency Medicine | Admitting: Emergency Medicine

## 2017-09-16 ENCOUNTER — Other Ambulatory Visit: Payer: Self-pay

## 2017-09-16 ENCOUNTER — Encounter (HOSPITAL_COMMUNITY): Payer: Self-pay

## 2017-09-16 ENCOUNTER — Emergency Department (HOSPITAL_COMMUNITY): Payer: BLUE CROSS/BLUE SHIELD

## 2017-09-16 DIAGNOSIS — Z7984 Long term (current) use of oral hypoglycemic drugs: Secondary | ICD-10-CM | POA: Insufficient documentation

## 2017-09-16 DIAGNOSIS — E119 Type 2 diabetes mellitus without complications: Secondary | ICD-10-CM | POA: Insufficient documentation

## 2017-09-16 DIAGNOSIS — I10 Essential (primary) hypertension: Secondary | ICD-10-CM | POA: Insufficient documentation

## 2017-09-16 DIAGNOSIS — Z87891 Personal history of nicotine dependence: Secondary | ICD-10-CM | POA: Insufficient documentation

## 2017-09-16 DIAGNOSIS — I4891 Unspecified atrial fibrillation: Secondary | ICD-10-CM | POA: Insufficient documentation

## 2017-09-16 DIAGNOSIS — Z7901 Long term (current) use of anticoagulants: Secondary | ICD-10-CM | POA: Insufficient documentation

## 2017-09-16 DIAGNOSIS — R55 Syncope and collapse: Secondary | ICD-10-CM | POA: Insufficient documentation

## 2017-09-16 DIAGNOSIS — Z79899 Other long term (current) drug therapy: Secondary | ICD-10-CM | POA: Insufficient documentation

## 2017-09-16 LAB — HEPATIC FUNCTION PANEL
ALT: 21 U/L (ref 0–44)
AST: 32 U/L (ref 15–41)
Albumin: 3.5 g/dL (ref 3.5–5.0)
Alkaline Phosphatase: 62 U/L (ref 38–126)
BILIRUBIN DIRECT: 0.2 mg/dL (ref 0.0–0.2)
BILIRUBIN TOTAL: 0.9 mg/dL (ref 0.3–1.2)
Indirect Bilirubin: 0.7 mg/dL (ref 0.3–0.9)
Total Protein: 7.3 g/dL (ref 6.5–8.1)

## 2017-09-16 LAB — CBC
HCT: 40.7 % (ref 36.0–46.0)
Hemoglobin: 13.3 g/dL (ref 12.0–15.0)
MCH: 28.5 pg (ref 26.0–34.0)
MCHC: 32.7 g/dL (ref 30.0–36.0)
MCV: 87.3 fL (ref 78.0–100.0)
Platelets: 212 10*3/uL (ref 150–400)
RBC: 4.66 MIL/uL (ref 3.87–5.11)
RDW: 13.4 % (ref 11.5–15.5)
WBC: 6.4 10*3/uL (ref 4.0–10.5)

## 2017-09-16 LAB — DIFFERENTIAL
ABS IMMATURE GRANULOCYTES: 0 10*3/uL (ref 0.0–0.1)
BASOS PCT: 0 %
Basophils Absolute: 0 10*3/uL (ref 0.0–0.1)
Eosinophils Absolute: 0.1 10*3/uL (ref 0.0–0.7)
Eosinophils Relative: 2 %
IMMATURE GRANULOCYTES: 0 %
LYMPHS PCT: 45 %
Lymphs Abs: 2.9 10*3/uL (ref 0.7–4.0)
MONO ABS: 0.6 10*3/uL (ref 0.1–1.0)
Monocytes Relative: 9 %
NEUTROS ABS: 2.8 10*3/uL (ref 1.7–7.7)
Neutrophils Relative %: 44 %

## 2017-09-16 LAB — BASIC METABOLIC PANEL
Anion gap: 10 (ref 5–15)
BUN: 13 mg/dL (ref 8–23)
CALCIUM: 8.9 mg/dL (ref 8.9–10.3)
CO2: 23 mmol/L (ref 22–32)
Chloride: 106 mmol/L (ref 98–111)
Creatinine, Ser: 1 mg/dL (ref 0.44–1.00)
GFR calc Af Amer: >60 mL/min (ref >60)
GFR, EST NON AFRICAN AMERICAN: 58 mL/min — AB (ref >60)
Glucose, Bld: 100 mg/dL — ABNORMAL HIGH (ref 70–99)
Potassium: 3.6 mmol/L (ref 3.5–5.1)
Sodium: 139 mmol/L (ref 135–145)

## 2017-09-16 LAB — I-STAT TROPONIN, ED: Troponin i, poc: 0.01 ng/mL (ref 0.00–0.08)

## 2017-09-16 MED ORDER — RIVAROXABAN 15 MG PO TABS: 15.0000 mg | ORAL_TABLET | Freq: Every day | ORAL | 0 refills | Status: AC

## 2017-09-16 MED ORDER — RIVAROXABAN 15 MG PO TABS
15.0000 mg | ORAL_TABLET | Freq: Once | ORAL | Status: AC
  Administered 2017-09-16: 15 mg via ORAL
  Filled 2017-09-16: qty 1

## 2017-09-16 NOTE — ED Triage Notes (Signed)
Pt arrived via EMS from home with c/o rapid heart beat for approx 1 hr wit some dizziness; pt went to pick husband up from work and stated that her chest became tight; EMS reports HR at 160; pt rec'd total of 5mg  Lopressor and 1000 mL of NS; HR decreased to 74 and reg rhythm; 148/74

## 2017-09-16 NOTE — ED Notes (Signed)
Patient left at this time with all belongings. 

## 2017-09-16 NOTE — Discharge Instructions (Addendum)
Follow-up with Dr. Anne FuSkains or any of his associates at the [facility]

## 2017-09-16 NOTE — ED Provider Notes (Signed)
[facility] EMERGENCY [facility]PARTMENT Provider Note   CSN: [id number] Arrival date & time: 09/16/17  1618     History   Chief Complaint Chief Complaint  Patient presents with  . Tachycardia    HPI Osker MasonClementine Mordan is a 67 y.o. female.  Patient states that she felt like her heart was beating fast.  She was seen by the paramedics and she was in rapid atrial fib at about 140.  Patient was given 2-1/2 mg of Lopressor and that slowed it down and then she had another 2-1/2 of Lopressor which converted her to normal sinus rhythm.  Patient feels fine now  The history is provided by the patient. No language interpreter was used.  Palpitations   This is a new problem. The current episode started 1 to 2 hours ago. The problem occurs rarely. The problem has been resolved. The problem is associated with an unknown factor. Associated symptoms include near-syncope. Pertinent negatives include no chest pain, no abdominal pain, no headaches, no back pain and no cough.    Past Medical History:  Diagnosis Date  . Allergy   . Hyperlipidemia   . Hypertension     Patient Active Problem List   Diagnosis Date Noted  . Abnormal nuclear stress test   . PAF (paroxysmal atrial fibrillation) (HCC) 07/27/2016  . Chest pain 07/26/2016  . Vitamin D deficiency 06/14/2016  . BMI 30.0-30.9,adult 09/10/2015  . Diabetes mellitus type 2, controlled, without complications (HCC) 02/07/2014  . Elevated LDL cholesterol level 02/07/2014  . Environmental and seasonal allergies 02/07/2014  . Essential hypertension 01/15/2014  . DD (diverticular disease) 12/27/2011  . Hot flash, menopausal 11/23/2010  . Acute stress disorder 11/23/2010    Past Surgical History:  Procedure Laterality Date  . ABDOMINAL HYSTERECTOMY    . APPENDECTOMY    . BREAST LUMPECTOMY Bilateral   . CHOLECYSTECTOMY    . LEFT HEART CATH AND CORONARY ANGIOGRAPHY N/A 07/28/2016   Procedure: Left Heart Cath and Coronary  Angiography;  Surgeon: Marykay LexHarding, David W, MD;  [facility] LAB;  Service: Cardiovascular;  Laterality: N/A;     OB History   None      Home Medications    Prior to Admission medications   Medication Sig Start Date End Date Taking? Authorizing Provider  azelastine (ASTELIN) 0.1 % nasal spray Place 2 sprays into both nostrils 2 (two) times daily. Use in each nostril as directed 02/09/16   Porfirio OarJeffery, Chelle, PA-C  clonazePAM (KLONOPIN) 0.5 MG tablet TAKE 1 TABLET TWICE A DAY AS NEEDED FOR ANXIETY 11/05/16   Jeffery, Chelle, PA-C  estradiol (VIVELLE-DOT) 0.0375 MG/24HR PLACE 1 PATCH ONTO THE SKIN 2 (TWO) TIMES A WEEK. 12/12/16   Jeffery, Chelle, PA-C  fexofenadine (ALLEGRA) 180 MG tablet Take 180 mg by mouth daily.    [provider]  losartan-hydrochlorothiazide (HYZAAR) 100-25 MG tablet Take 1 tablet by mouth daily. Office visit needed for refills 09/04/17   Porfirio OarJeffery, Chelle, PA-C  metFORMIN (GLUCOPHAGE-XR) 500 MG 24 hr tablet Take 1 tablet (500 mg total) by mouth daily with breakfast. 12/05/16   Porfirio OarJeffery, Chelle, PA-C  montelukast (SINGULAIR) 10 MG tablet TAKE 1 TABLET (10 MG TOTAL) BY MOUTH AT BEDTIME. 12/01/16   Ethelda ChickSmith, Kristi M, MD  nitroGLYCERIN (NITROSTAT) 0.4 MG SL tablet Place 1 tablet (0.4 mg total) under the tongue every 5 (five) minutes as needed for chest pain. Patient not taking: Reported on 11/05/2016 07/29/16   Arnetha CourserAmin, Sumayya, MD  predniSONE (DELTASONE) 20 MG tablet  Take 3 PO QAM x3days, 2 PO QAM x3days, 1 PO QAM x3days 12/06/16   Porfirio Oar, PA-C  Rivaroxaban (XARELTO) 15 MG TABS tablet Take 1 tablet (15 mg total) by mouth daily with supper. 09/16/17   Bethann Berkshire, MD  rosuvastatin (CRESTOR) 10 MG tablet Take 1 tablet (10 mg total) by mouth daily. 12/05/16   Porfirio Oar, PA-C  Vitamin D, Ergocalciferol, (DRISDOL) 50000 units CAPS capsule Take 1 capsule (50,000 Units total) by mouth every 7 (seven) days. 12/05/16   Porfirio Oar, PA-C    Family  History Family History  Problem Relation Age of Onset  . Hypertension Mother   . Diabetes Mother   . Hyperlipidemia Mother   . CVA Mother   . Rheum arthritis Father   . Hyperlipidemia Sister   . Hypertension Sister   . Hypertension Brother     Social History Social History   Tobacco Use  . Smoking status: Former Games developer  . Smokeless tobacco: Never Used  . Tobacco comment: as a teenager  Substance Use Topics  . Alcohol use: No    Alcohol/week: 0.0 oz  . Drug use: No     Allergies   Amlodipine; Codeine; Percocet [oxycodone-acetaminophen]; and Trileptal [oxcarbazepine]   Review of Systems Review of Systems  Constitutional: Negative for appetite change and fatigue.  HENT: Negative for congestion, ear discharge and sinus pressure.   Eyes: Negative for discharge.  Respiratory: Negative for cough.   Cardiovascular: Positive for palpitations and near-syncope. Negative for chest pain.  Gastrointestinal: Negative for abdominal pain and diarrhea.  Genitourinary: Negative for frequency and hematuria.  Musculoskeletal: Negative for back pain.  Skin: Negative for rash.  Neurological: Negative for seizures and headaches.  Psychiatric/Behavioral: Negative for hallucinations.     Physical Exam Updated Vital Signs BP 136/82   Pulse (!) 57   Resp 16   Ht 5\' 4"  (1.626 m)   Wt 83.9 kg (185 lb)   SpO2 100%   BMI 31.76 kg/m   Physical Exam  Constitutional: She is oriented to person, place, and time. She appears well-developed.  HENT:  Head: Normocephalic.  Eyes: Conjunctivae and EOM are normal. No scleral icterus.  Neck: Neck supple. No thyromegaly present.  Cardiovascular: Normal rate and regular rhythm. Exam reveals no gallop and no friction rub.  No murmur heard. Pulmonary/Chest: No stridor. She has no wheezes. She has no rales. She exhibits no tenderness.  Abdominal: She exhibits no distension. There is no tenderness. There is no rebound.  Musculoskeletal: Normal range  of motion. She exhibits no edema.  Lymphadenopathy:    She has no cervical adenopathy.  Neurological: She is oriented to person, place, and time. She exhibits normal muscle tone. Coordination normal.  Skin: No rash noted. No erythema.  Psychiatric: She has a normal mood and affect. Her behavior is normal.     ED Treatments / Results  Labs (all labs ordered are listed, but only abnormal results are displayed) Labs Reviewed  BASIC METABOLIC PANEL - Abnormal; Notable for the following components:      Result Value   Glucose, Bld 100 (*)    GFR calc non Af Amer 58 (*)    All other components within normal limits  CBC  HEPATIC FUNCTION PANEL  DIFFERENTIAL  I-STAT TROPONIN, ED    EKG None  Radiology Dg Chest 2 View  Result Date: 09/16/2017 CLINICAL DATA:  Chest pain EXAM: CHEST - 2 VIEW COMPARISON:  07/26/2016 chest radiograph. FINDINGS: Intact sternotomy wires. Stable cardiomediastinal silhouette  with normal heart size. No pneumothorax. No pleural effusion. Lungs appear clear, with no acute consolidative airspace disease and no pulmonary edema. Cholecystectomy clips are seen in the right upper quadrant of the abdomen. IMPRESSION: No active cardiopulmonary disease. Electronically Signed   By: Delbert Phenix M.D.   On: 09/16/2017 17:22    Procedures Procedures (including critical care time)  Medications Ordered in ED Medications  Rivaroxaban (XARELTO) tablet 15 mg (has no administration in time range)     Initial Impression / Assessment and Plan / ED Course  I have reviewed the triage vital signs and the nursing notes.  Pertinent labs & imaging results that were available during my care of the patient were reviewed by me and considered in my medical decision making (see chart for details).     I was able to evaluate the rhythm strips from the paramedics and the patient definitely with an atrial fib.  I spoke with cardiology and we will start her on Xarelto and she will  follow-up with cardiology for further work-up.  Labs unremarkable  Final Clinical Impressions(s) / ED Diagnoses   Final diagnoses:  Atrial fibrillation with [facility])    ED Discharge Orders        Ordered    Rivaroxaban (XARELTO) 15 MG TABS tablet  Daily with supper     09/16/17 1914       Bethann Berkshire, MD 09/16/17 1919

## 2017-09-18 ENCOUNTER — Telehealth (HOSPITAL_COMMUNITY): Payer: Self-pay | Admitting: *Deleted

## 2017-09-18 NOTE — Telephone Encounter (Signed)
I LMOM for pt to clbk to sched f/u appt.  Pt seen in ED and appears was put on a lower dose of Xarelto.  Pt size, age and creat. Appears she may need to be on xarelto 20 mg.

## 2017-09-25 ENCOUNTER — Encounter (HOSPITAL_COMMUNITY): Payer: Self-pay | Admitting: *Deleted

## 2017-09-27 ENCOUNTER — Telehealth: Payer: Self-pay | Admitting: Family Medicine

## 2017-09-27 NOTE — Telephone Encounter (Signed)
Copied from CRM [phone]#[id number]. Topic: General - Other >> Sep 27, 2017  9:41 AM Gaynelle AduPoole, Shalonda wrote: Reason for CRM: Patient is calling stating she use to see Porfirio OarJeffery, Chelle, Pa. She only has 6 days of the Closartan-hydrochlorothiazide (HYZAAR) 100-25 MG tablet left and she doesn't have insurance until  the end of august and she is unable to afford her medication out of pocket . She is wanting to see if   she is able to have samples that will hold her over. She also  stated that is not able to come into the office due to no insurance.  Her best contact number [phone]. Please Advise

## 2017-10-01 ENCOUNTER — Other Ambulatory Visit: Payer: Self-pay

## 2017-10-01 DIAGNOSIS — I10 Essential (primary) hypertension: Secondary | ICD-10-CM

## 2017-10-01 MED ORDER — LOSARTAN POTASSIUM-HCTZ 100-25 MG PO TABS: 1.0000 | ORAL_TABLET | Freq: Every day | ORAL | 0 refills | Status: AC

## 2017-10-01 NOTE — Telephone Encounter (Signed)
Spoke with pt.  [place]. [place] appt [place] [facility] in [place] in Aug Sent in enough Hyzaar to get her to that appt/

## 2017-10-30 ENCOUNTER — Other Ambulatory Visit: Payer: Self-pay | Admitting: Family Medicine

## 2017-10-30 DIAGNOSIS — I10 Essential (primary) hypertension: Secondary | ICD-10-CM

## 2017-10-30 NOTE — Telephone Encounter (Signed)
Losartan-hctz  refill Last Refill:10/02/47 # 60 Last OV: 11/05/17 PCP: Solmon IceSantiago, I .MD [facility]  [place]   [id number]

## 2018-09-01 IMAGING — CR DG CHEST 2V
2 series · 2 of 2 positions shown · non-contrast
Comparison: 07/26/2016 chest radiograph.

CLINICAL DATA: Chest pain

EXAM:
CHEST - 2 VIEW

[chest pa]
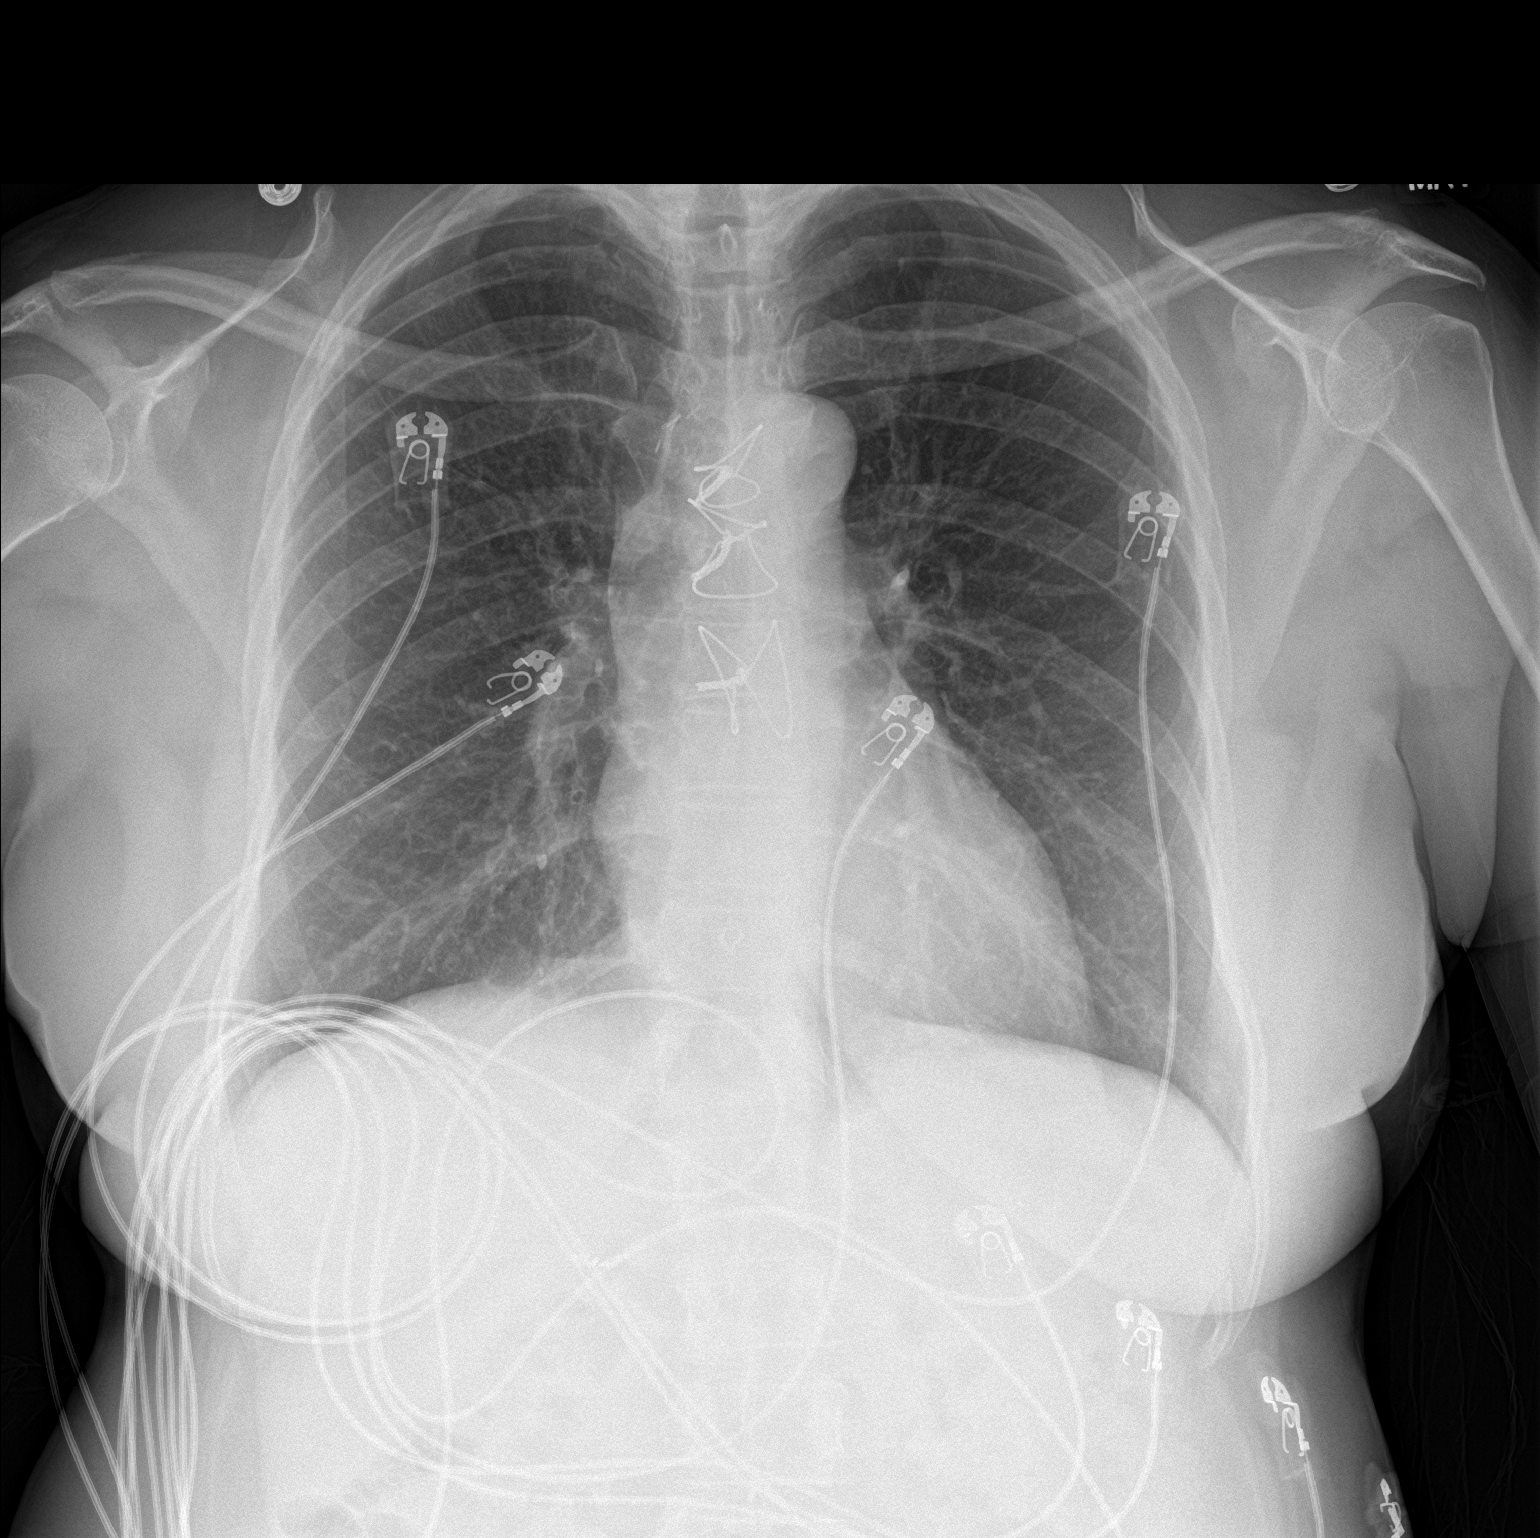

[chest lat]
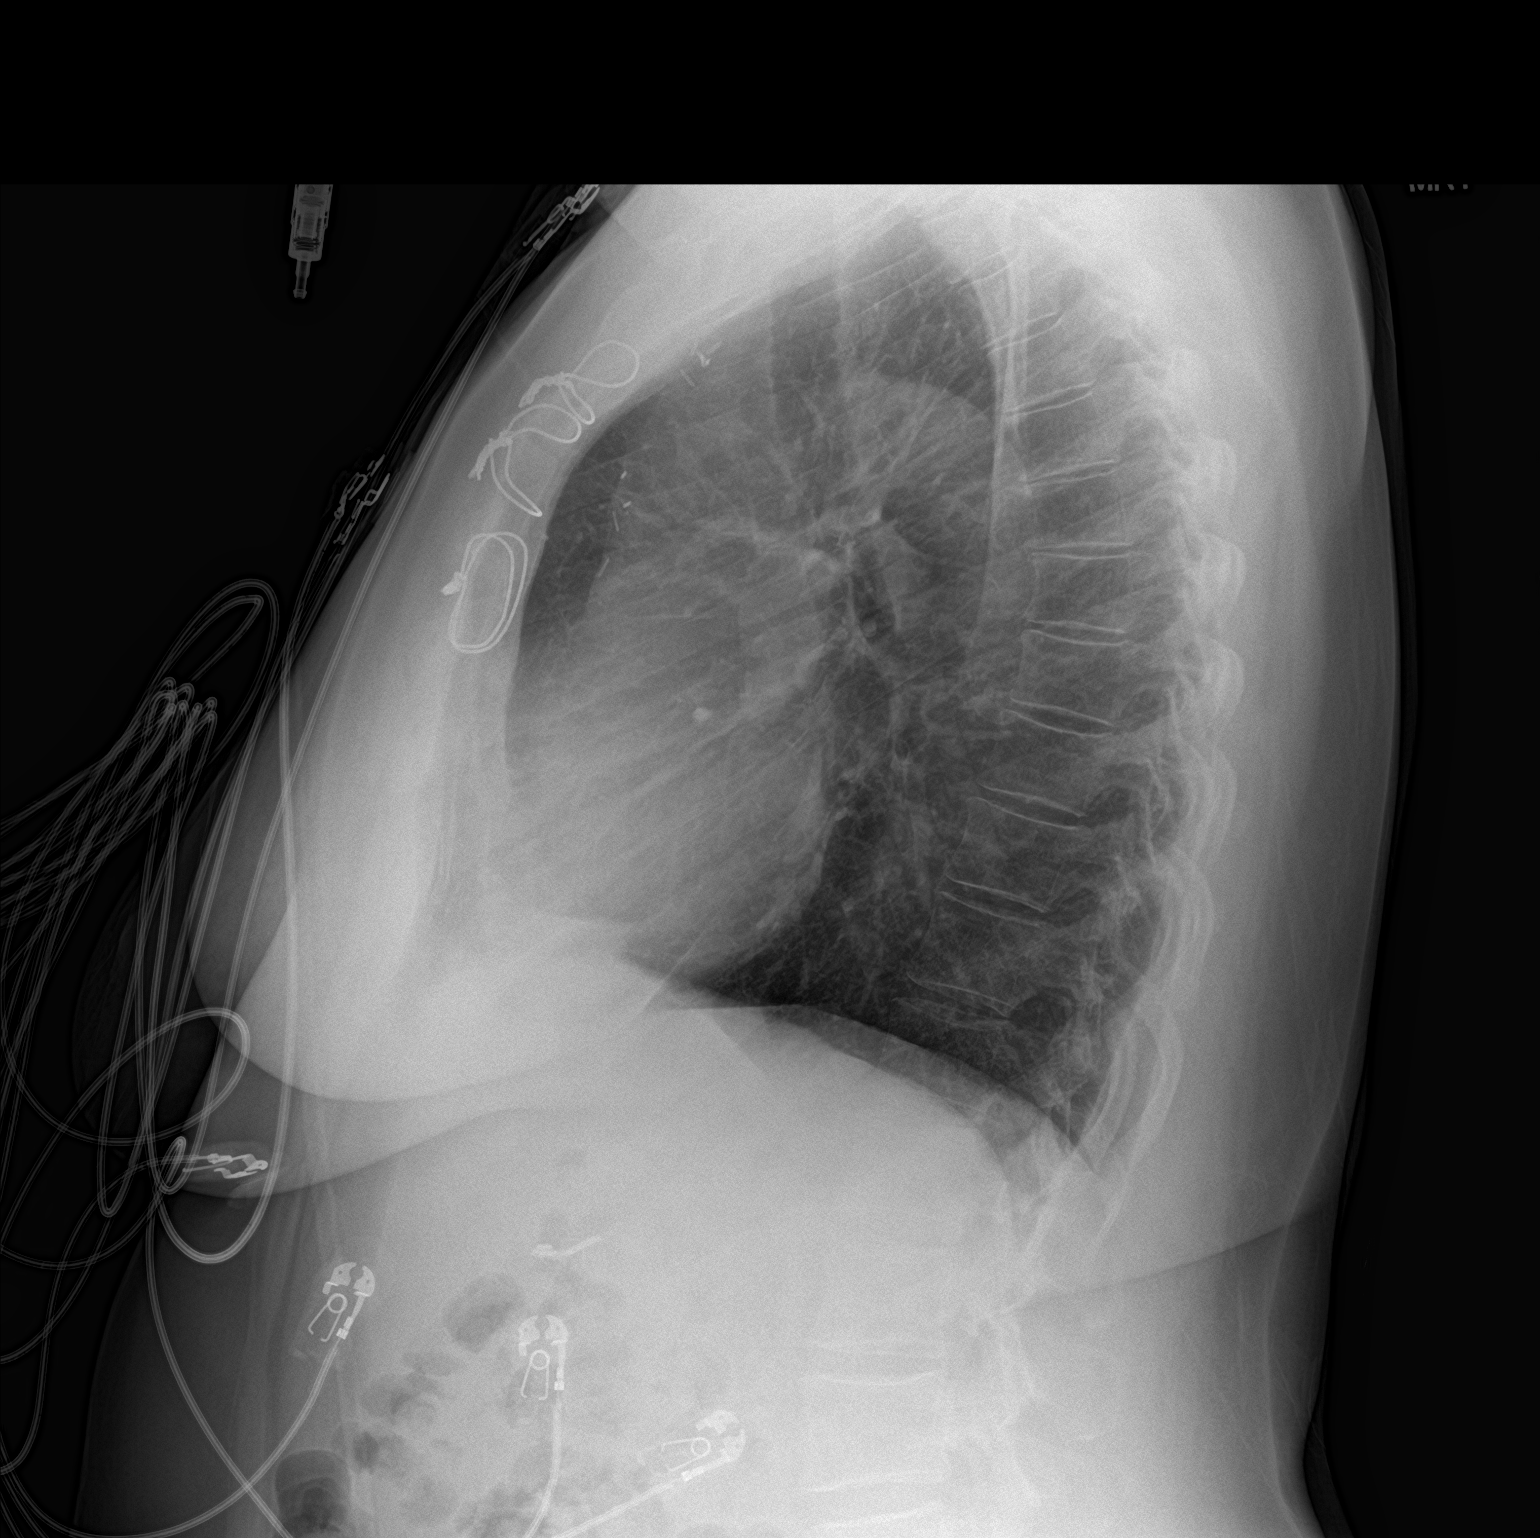

[2 of 2 positions shown; findings below may reference images not displayed]

FINDINGS: Intact sternotomy wires. Stable cardiomediastinal silhouette with
normal heart size. No pneumothorax. No pleural effusion. Lungs
appear clear, with no acute consolidative airspace disease and no
pulmonary edema. Cholecystectomy clips are seen in the right upper
quadrant of the abdomen.
IMPRESSION: No active cardiopulmonary disease.
